# Patient Record
Sex: Female | Born: 1940 | Race: White | Hispanic: No | Marital: Married | Smoking: Former smoker
Health system: Southern US, Community
[De-identification: ages and names within clinical notes are randomized; demographics above are authoritative.]

## PROBLEM LIST (undated history)

## (undated) DIAGNOSIS — I499 Cardiac arrhythmia, unspecified: Secondary | ICD-10-CM

## (undated) DIAGNOSIS — M8580 Other specified disorders of bone density and structure, unspecified site: Secondary | ICD-10-CM

## (undated) DIAGNOSIS — I10 Essential (primary) hypertension: Secondary | ICD-10-CM

## (undated) DIAGNOSIS — Q211 Atrial septal defect, unspecified: Secondary | ICD-10-CM

## (undated) DIAGNOSIS — E049 Nontoxic goiter, unspecified: Secondary | ICD-10-CM

## (undated) DIAGNOSIS — F419 Anxiety disorder, unspecified: Secondary | ICD-10-CM

## (undated) DIAGNOSIS — R112 Nausea with vomiting, unspecified: Secondary | ICD-10-CM

## (undated) DIAGNOSIS — Z9889 Other specified postprocedural states: Secondary | ICD-10-CM

## (undated) DIAGNOSIS — R0989 Other specified symptoms and signs involving the circulatory and respiratory systems: Secondary | ICD-10-CM

## (undated) DIAGNOSIS — K5730 Diverticulosis of large intestine without perforation or abscess without bleeding: Secondary | ICD-10-CM

## (undated) DIAGNOSIS — I4891 Unspecified atrial fibrillation: Secondary | ICD-10-CM

## (undated) DIAGNOSIS — C50411 Malignant neoplasm of upper-outer quadrant of right female breast: Principal | ICD-10-CM

## (undated) HISTORY — DX: Atrial septal defect, unspecified: Q21.10

## (undated) HISTORY — PX: VARICOSE VEIN SURGERY: SHX832

## (undated) HISTORY — DX: Anxiety disorder, unspecified: F41.9

## (undated) HISTORY — DX: Other specified symptoms and signs involving the circulatory and respiratory systems: R09.89

## (undated) HISTORY — DX: Nontoxic goiter, unspecified: E04.9

## (undated) HISTORY — DX: Atrial septal defect: Q21.1

## (undated) HISTORY — DX: Other specified disorders of bone density and structure, unspecified site: M85.80

## (undated) HISTORY — DX: Essential (primary) hypertension: I10

## (undated) HISTORY — DX: Diverticulosis of large intestine without perforation or abscess without bleeding: K57.30

## (undated) HISTORY — DX: Unspecified atrial fibrillation: I48.91

## (undated) HISTORY — PX: ABDOMINAL HYSTERECTOMY: SHX81

## (undated) HISTORY — DX: Malignant neoplasm of upper-outer quadrant of right female breast: C50.411

## (undated) HISTORY — PX: BUNIONECTOMY: SHX129

## (undated) HISTORY — PX: ASD REPAIR: SHX258

---

## 2001-04-14 ENCOUNTER — Encounter: Admission: RE | Admit: 2001-04-14 | Discharge: 2001-04-14 | Payer: Self-pay | Admitting: Family Medicine

## 2001-04-14 ENCOUNTER — Encounter: Payer: Self-pay | Admitting: Family Medicine

## 2001-05-14 ENCOUNTER — Ambulatory Visit (HOSPITAL_COMMUNITY): Admission: RE | Admit: 2001-05-14 | Discharge: 2001-05-14 | Payer: Self-pay | Admitting: Neurosurgery

## 2001-05-15 ENCOUNTER — Encounter: Payer: Self-pay | Admitting: Neurosurgery

## 2001-05-15 ENCOUNTER — Ambulatory Visit (HOSPITAL_COMMUNITY): Admission: RE | Admit: 2001-05-15 | Discharge: 2001-05-15 | Payer: Self-pay | Admitting: Neurosurgery

## 2003-05-05 ENCOUNTER — Ambulatory Visit (HOSPITAL_COMMUNITY): Admission: RE | Admit: 2003-05-05 | Discharge: 2003-05-05 | Payer: Self-pay | Admitting: Family Medicine

## 2004-01-12 ENCOUNTER — Ambulatory Visit (HOSPITAL_COMMUNITY): Admission: RE | Admit: 2004-01-12 | Discharge: 2004-01-12 | Payer: Self-pay | Admitting: Family Medicine

## 2005-01-13 ENCOUNTER — Ambulatory Visit (HOSPITAL_COMMUNITY): Admission: RE | Admit: 2005-01-13 | Discharge: 2005-01-13 | Payer: Self-pay | Admitting: Family Medicine

## 2005-01-24 ENCOUNTER — Encounter: Admission: RE | Admit: 2005-01-24 | Discharge: 2005-01-24 | Payer: Self-pay | Admitting: Family Medicine

## 2005-02-09 ENCOUNTER — Emergency Department (HOSPITAL_COMMUNITY): Admission: EM | Admit: 2005-02-09 | Discharge: 2005-02-09 | Payer: Self-pay | Admitting: Emergency Medicine

## 2005-07-28 ENCOUNTER — Encounter: Admission: RE | Admit: 2005-07-28 | Discharge: 2005-07-28 | Payer: Self-pay | Admitting: Family Medicine

## 2005-12-18 ENCOUNTER — Other Ambulatory Visit: Admission: RE | Admit: 2005-12-18 | Discharge: 2005-12-18 | Payer: Self-pay | Admitting: Family Medicine

## 2006-01-19 ENCOUNTER — Encounter: Admission: RE | Admit: 2006-01-19 | Discharge: 2006-01-19 | Payer: Self-pay | Admitting: Family Medicine

## 2007-03-11 ENCOUNTER — Encounter: Admission: RE | Admit: 2007-03-11 | Discharge: 2007-03-11 | Payer: Self-pay | Admitting: Family Medicine

## 2008-03-13 ENCOUNTER — Encounter: Admission: RE | Admit: 2008-03-13 | Discharge: 2008-03-13 | Payer: Self-pay | Admitting: Family Medicine

## 2009-03-15 ENCOUNTER — Encounter: Admission: RE | Admit: 2009-03-15 | Discharge: 2009-03-15 | Payer: Self-pay | Admitting: Family Medicine

## 2010-03-18 ENCOUNTER — Encounter: Admission: RE | Admit: 2010-03-18 | Discharge: 2010-03-18 | Payer: Self-pay | Admitting: Family Medicine

## 2010-12-07 ENCOUNTER — Encounter: Payer: Self-pay | Admitting: Gastroenterology

## 2010-12-10 ENCOUNTER — Encounter: Payer: Self-pay | Admitting: Gastroenterology

## 2011-02-13 ENCOUNTER — Other Ambulatory Visit: Payer: Self-pay | Admitting: Family Medicine

## 2011-02-13 DIAGNOSIS — Z1231 Encounter for screening mammogram for malignant neoplasm of breast: Secondary | ICD-10-CM

## 2011-04-09 ENCOUNTER — Ambulatory Visit
Admission: RE | Admit: 2011-04-09 | Discharge: 2011-04-09 | Disposition: A | Discharge status: Home / Self Care | Payer: Medicare Other | Source: Ambulatory Visit | Attending: Family Medicine | Admitting: Family Medicine

## 2011-04-09 DIAGNOSIS — Z1231 Encounter for screening mammogram for malignant neoplasm of breast: Secondary | ICD-10-CM

## 2012-03-04 ENCOUNTER — Other Ambulatory Visit: Payer: Self-pay | Admitting: Family Medicine

## 2012-03-04 DIAGNOSIS — Z1231 Encounter for screening mammogram for malignant neoplasm of breast: Secondary | ICD-10-CM

## 2012-04-20 ENCOUNTER — Ambulatory Visit
Admission: RE | Admit: 2012-04-20 | Discharge: 2012-04-20 | Disposition: A | Discharge status: Home / Self Care | Payer: Medicare Other | Source: Ambulatory Visit | Attending: Family Medicine | Admitting: Family Medicine

## 2012-04-20 DIAGNOSIS — Z1231 Encounter for screening mammogram for malignant neoplasm of breast: Secondary | ICD-10-CM

## 2013-04-04 ENCOUNTER — Other Ambulatory Visit: Payer: Self-pay

## 2013-04-04 DIAGNOSIS — Z1231 Encounter for screening mammogram for malignant neoplasm of breast: Secondary | ICD-10-CM

## 2013-04-27 ENCOUNTER — Ambulatory Visit
Admission: RE | Admit: 2013-04-27 | Discharge: 2013-04-27 | Disposition: A | Discharge status: Home / Self Care | Payer: Medicare Other | Source: Ambulatory Visit

## 2013-04-27 DIAGNOSIS — Z1231 Encounter for screening mammogram for malignant neoplasm of breast: Secondary | ICD-10-CM

## 2013-09-16 ENCOUNTER — Encounter: Payer: Self-pay | Admitting: Gastroenterology

## 2013-11-11 ENCOUNTER — Encounter: Payer: Self-pay | Admitting: Gastroenterology

## 2014-03-27 ENCOUNTER — Other Ambulatory Visit: Payer: Self-pay

## 2014-03-27 DIAGNOSIS — Z1231 Encounter for screening mammogram for malignant neoplasm of breast: Secondary | ICD-10-CM

## 2014-05-03 ENCOUNTER — Ambulatory Visit
Admission: RE | Admit: 2014-05-03 | Discharge: 2014-05-03 | Disposition: A | Discharge status: Home / Self Care | Payer: Medicare Other | Source: Ambulatory Visit

## 2014-05-03 DIAGNOSIS — Z1231 Encounter for screening mammogram for malignant neoplasm of breast: Secondary | ICD-10-CM

## 2015-04-03 ENCOUNTER — Other Ambulatory Visit: Payer: Self-pay

## 2015-04-03 DIAGNOSIS — Z1231 Encounter for screening mammogram for malignant neoplasm of breast: Secondary | ICD-10-CM

## 2015-05-09 ENCOUNTER — Ambulatory Visit
Admission: RE | Admit: 2015-05-09 | Discharge: 2015-05-09 | Disposition: A | Discharge status: Home / Self Care | Payer: Medicare Other | Source: Ambulatory Visit

## 2015-05-09 DIAGNOSIS — Z1231 Encounter for screening mammogram for malignant neoplasm of breast: Secondary | ICD-10-CM

## 2015-05-11 ENCOUNTER — Other Ambulatory Visit: Payer: Self-pay | Admitting: Family Medicine

## 2015-05-11 DIAGNOSIS — R928 Other abnormal and inconclusive findings on diagnostic imaging of breast: Secondary | ICD-10-CM

## 2015-05-21 ENCOUNTER — Ambulatory Visit
Admission: RE | Admit: 2015-05-21 | Discharge: 2015-05-21 | Disposition: A | Discharge status: Home / Self Care | Payer: Medicare Other | Source: Ambulatory Visit | Attending: Family Medicine | Admitting: Family Medicine

## 2015-05-21 ENCOUNTER — Other Ambulatory Visit: Payer: Self-pay | Admitting: Family Medicine

## 2015-05-21 DIAGNOSIS — R928 Other abnormal and inconclusive findings on diagnostic imaging of breast: Secondary | ICD-10-CM

## 2015-05-30 ENCOUNTER — Other Ambulatory Visit: Payer: Self-pay | Admitting: Family Medicine

## 2015-05-30 ENCOUNTER — Ambulatory Visit: Payer: Medicare Other

## 2015-05-30 ENCOUNTER — Ambulatory Visit
Admission: RE | Admit: 2015-05-30 | Discharge: 2015-05-30 | Disposition: A | Discharge status: Home / Self Care | Payer: Medicare Other | Source: Ambulatory Visit | Attending: Family Medicine | Admitting: Family Medicine

## 2015-05-30 DIAGNOSIS — R928 Other abnormal and inconclusive findings on diagnostic imaging of breast: Secondary | ICD-10-CM

## 2015-06-04 ENCOUNTER — Ambulatory Visit
Admission: RE | Admit: 2015-06-04 | Discharge: 2015-06-04 | Disposition: A | Discharge status: Home / Self Care | Payer: Medicare Other | Source: Ambulatory Visit | Attending: Family Medicine | Admitting: Family Medicine

## 2015-06-04 DIAGNOSIS — R928 Other abnormal and inconclusive findings on diagnostic imaging of breast: Secondary | ICD-10-CM

## 2015-06-05 ENCOUNTER — Encounter: Payer: Self-pay | Admitting: *Deleted

## 2015-06-05 ENCOUNTER — Telehealth: Payer: Self-pay | Admitting: *Deleted

## 2015-06-05 DIAGNOSIS — C50411 Malignant neoplasm of upper-outer quadrant of right female breast: Secondary | ICD-10-CM

## 2015-06-05 HISTORY — DX: Malignant neoplasm of upper-outer quadrant of right female breast: C50.411

## 2015-06-05 NOTE — Telephone Encounter (Signed)
Confirmed [facility] for 06/13/15 at 1230 .  Instructions and contact information given. 

## 2015-06-06 ENCOUNTER — Telehealth: Payer: Self-pay | Admitting: *Deleted

## 2015-06-06 NOTE — Telephone Encounter (Signed)
Mailed clinic packet to pt.  

## 2015-06-12 NOTE — Progress Notes (Addendum)
Radiation Oncology         [phone] ________________________________  Initial outpatient Consultation  Name: ARMETA HAMMAD MRN: [id number]  Date: 06/13/2015  DOB: Jun 14, 1941  [id number], MD  Rolm Bookbinder, MD   REFERRING PHYSICIAN: Rolm Bookbinder, MD  DIAGNOSIS:    ICD-9-CM ICD-10-CM   1. Breast cancer of upper-outer quadrant of right female breast (HCC) 174.4 C50.411    Stage 0 Right Breast UOQ  Ductal Carcinoma in Situ, ER+ / PR+, Low Grade   HISTORY OF PRESENT ILLNESS::Teona Jerilynn Mages Nemeroff is a 74 y.o. female who presented with right breast calcifications on screening mammogram.  Biopsy showed intermediate grade DCIS with characteristics as described above in the diagnosis.  She lives near [place] and drives about an hour to reach [place]. She denies prior cancer.  PREVIOUS RADIATION THERAPY: No  PAST MEDICAL HISTORY:  has a past medical history of Breast cancer of upper-outer quadrant of right female breast ([place]) (06/05/2015); Hypertension; Atrial fibrillation ([place]); Diverticulosis of colon; Goiter; Osteopenia; Carotid bruit; ASD (atrial septal defect); and Anxiety.    PAST SURGICAL HISTORY: Past Surgical History  Procedure Laterality Date  . Abdominal hysterectomy    . Asd repair    . Varicose vein surgery Left   . Bunionectomy Bilateral     FAMILY HISTORY: family history is not on file.  SOCIAL HISTORY:  reports that she quit smoking about 35 years ago. Her smoking use included Cigarettes. She smoked 0.50 packs per day. She does not have any smokeless tobacco history on file. She reports that she does not drink alcohol or use illicit drugs. Married with 2 grown children; she was born in [place]. Quit smoking decades ago in 1981.   ALLERGIES: Review of patient's allergies indicates not on file.  MEDICATIONS:  Current Outpatient Prescriptions  Medication Sig Dispense Refill  . aspirin EC 81 MG tablet Take 81 mg by mouth.    Marland Kitchen atorvastatin  (LIPITOR) 40 MG tablet Take 40 mg by mouth.    . Calcium Carb-Cholecalciferol (CALCIUM 600/VITAMIN D3) 600-800 MG-UNIT TABS Take 1 tablet by mouth.    . Cholecalciferol (VITAMIN D-1000 MAX ST) 1000 UNITS tablet Take 2,000 Units by mouth.    . flecainide (TAMBOCOR) 100 MG tablet Take 100 mg by mouth.    . metoprolol tartrate (LOPRESSOR) 25 MG tablet Take 12.5 mg by mouth.    . Multiple Vitamin (MULTIVITAMIN) capsule Take 1 capsule by mouth.    . Rivaroxaban (XARELTO) 15 MG TABS tablet Take 15 mg by mouth.     No current facility-administered medications for this encounter.    REVIEW OF SYSTEMS:  Notable for that above.   PHYSICAL EXAM:   Vitals with Age-Percentiles 06/13/2015  Length [id number] cm  Systolic [id number]  Diastolic 65  Pulse 78  Respiration 20  Weight 81.103 kg  BMI 26.8  VISIT REPORT    General: Alert and oriented, in no acute distress HEENT: Head is normocephalic; Oropharynx is clear. Neck: fullness in region of right thyroid (she reports history of goiter), no palpable cervical or supraclavicular lymphadenopathy. Heart: Regular in rate and rhythm with no murmurs, rubs, or gallops. Chest: Clear to auscultation bilaterally, with no rhonchi, wheezes, or rales. Abdomen: Soft, nontender, nondistended, with no rigidity or guarding. Extremities: No cyanosis; there is some right ankle swelling Lymphatics: see Neck Exam Skin: No concerning lesions. Musculoskeletal: symmetric strength and muscle tone throughout. Neurologic: Cranial nerves II through XII are grossly intact. No obvious focalities. Speech is fluent. Coordination is intact. Psychiatric:  Judgment and insight are intact. Affect is appropriate. Breasts: There is a palpable cord in the UOQ of the right breast; this may be the biopsy tract. There are surrounding ecchymoses. No other palpable lesions in either breast. No axillary masses bilaterally.   ECOG = 0  0 - Asymptomatic (Fully active, able to carry on all predisease  activities without restriction)  1 - Symptomatic but completely ambulatory (Restricted in physically strenuous activity but ambulatory and able to carry out work of a light or sedentary nature. For example, light housework, office work)  2 - Symptomatic, <50% in bed during the day (Ambulatory and capable of all self care but unable to carry out any work activities. Up and about more than 50% of waking hours)  3 - Symptomatic, >50% in bed, but not bedbound (Capable of only limited self-care, confined to bed or chair 50% or more of waking hours)  4 - Bedbound (Completely disabled. Cannot carry on any self-care. Totally confined to bed or chair)  5 - Death   Eustace Pen MM, Creech RH, Tormey DC, et al. [phone]). "Toxicity and response criteria of the [facility]". [place] Oncol. 5 (6): 649-55   LABORATORY DATA:  Lab Results  Component Value Date   WBC 5.1 06/13/2015   HGB 12.5 06/13/2015   HCT 38.3 06/13/2015   MCV 91.2 06/13/2015   PLT 212 06/13/2015   CMP     Component Value Date/Time   NA 141 06/13/2015 1219   K 4.0 06/13/2015 1219   CO2 28 06/13/2015 1219   GLUCOSE 93 06/13/2015 1219   BUN 13.7 06/13/2015 1219   CREATININE 1.0 06/13/2015 1219   CALCIUM 9.7 06/13/2015 1219   PROT 7.6 06/13/2015 1219   ALBUMIN 4.1 06/13/2015 1219   AST 19 06/13/2015 1219   ALT 12 06/13/2015 1219   ALKPHOS 63 06/13/2015 1219   BILITOT 0.47 06/13/2015 1219         RADIOGRAPHY: Mm Digital Diagnostic Unilat R  06/04/2015  CLINICAL DATA:  Confirmation of clip placement after stereotactic tomosynthesis core needle biopsy of suspicious calcifications in the upper outer quadrant of the right breast earlier today. EXAM: DIAGNOSTIC RIGHT MAMMOGRAM POST STEREOTACTIC BIOPSY COMPARISON:  Previous exam(s). FINDINGS: Mammographic images were obtained following stereotactic guided biopsy of a group of suspicious calcifications spanning approximately 3 cm in the upper outer quadrant of  the right breast, middle to posterior depth. The coil shaped tissue marker clip is appropriately positioned at the posterolateral margin of the calcifications. Expected post biopsy changes are present without evidence of hematoma. IMPRESSION: Appropriate positioning of the coil shaped tissue marker clip at the posterolateral margin of the calcifications in the upper outer quadrant of the right breast. Final Assessment: Post Procedure Mammograms for Marker Placement Electronically Signed   By: Evangeline Dakin M.D.   On: 06/04/2015 12:35   Mm Digital Diagnostic Unilat R  05/30/2015  ADDENDUM REPORT: 05/30/2015 14:06 ADDENDUM: The patient is currently on Xarelto and daily aspirin. She was been placed on these anticoagulants by her cardiologist for atrial fibrillation. However, she has been cardioverted and is currently in sinus rhythm. I have discussed her case with her cardiologist and her cardiologist (Dr Marylee Floras) feels that it would be safe to take her off anticoagulation prior to her stereotactic biopsy. She will discontinue her anticoagulation at this time and her biopsy has been rescheduled for Monday, 06/04/2015. Electronically Signed   By: Altamese Cabal M.D.   On: 05/30/2015 14:06  05/30/2015  CLINICAL DATA:  Screening recall for right breast calcifications. EXAM: DIGITAL DIAGNOSTIC RIGHT MAMMOGRAM WITH CAD COMPARISON:  Previous exam(s). ACR Breast Density Category b: There are scattered areas of fibroglandular density. FINDINGS: In the upper-outer quadrant of the right breast, there is a 2.3 cm group of amorphous and punctate calcifications. No other suspicious calcifications, masses or areas of distortion are seen in the bilateral breasts. Mammographic images were processed with CAD. IMPRESSION: There is a suspicious 2.3 cm group of calcifications in the upper-outer quadrant of the right breast. RECOMMENDATION: Stereotactic biopsy is recommended for the right breast calcifications. This has been  scheduled for 05/30/2015 at 11 a.m. I have discussed the findings and recommendations with the patient. Results were also provided in writing at the conclusion of the visit. If applicable, a reminder letter will be sent to the patient regarding the next appointment. BI-RADS CATEGORY  4: Suspicious abnormality - biopsy should be considered. Electronically Signed: By: Ammie Ferrier M.D. On: 05/21/2015 13:54   Mm Rt Breast Bx W Loc Dev 1st Lesion Image Bx Spec Stereo Guide  06/05/2015  ADDENDUM REPORT: 06/05/2015 14:52 ADDENDUM: Pathology reveals Low Grade DUCTAL CARCINOMA IN SITU WITH CALCIFICATIONS, ATYPICAL LOBULAR HYPERPLASIA, FIBROCYSTIC CHANGES WITH MICROCALCIFICATIONS AND USUAL DUCTAL HYPERPLASIA, BENIGN DUCTS WITH MICROCALCIFICATIONS of the upper outer quadrant of the Right breast. This was found to be concordant by Dr. Evangeline Dakin. Pathology results were discussed with the patient via telephone. She reported no problems with the biopsy site and is doing well. Post biopsy care and instructions were reviewed and questions were answered. She was encouraged to contact [facility] with any additional questions and or concerns. She was referred to [facility] on June 13, 2015. Pathology results reported by Terie Purser RN on June 05, 2015. Electronically Signed   By: Evangeline Dakin M.D.   On: 06/05/2015 14:52  06/05/2015  CLINICAL DATA:  74 year old with suspicious calcifications in the upper outer quadrant of the right breast, middle to posterior depth, spanning approximately 3 cm, identified on recent screening mammography. EXAM: RIGHT BREAST STEREOTACTIC CORE NEEDLE BIOPSY COMPARISON:  Previous exams. FINDINGS: The patient and I discussed the procedure of stereotactic-guided biopsy including benefits and alternatives. We discussed the high likelihood of a successful procedure. We discussed  the risks of the procedure including infection, bleeding, tissue injury, clip migration, and inadequate sampling. Informed written consent was given. The usual time out protocol was performed immediately prior to the procedure. Using sterile technique and 1% lidocaine and 1% lidocaine with epinephrine as local anesthetic, under stereotactic guidance, a 12 gauge vacuum assisted needle device was used to perform core needle biopsy of calcifications in the upper outer quadrant of the right breast using a superior approach. Specimen radiograph was performed showing calcifications in at least 5 of the core samples. Specimens with calcifications are identified for pathology. At the conclusion of the procedure, a coil shaped tissue marker clip was deployed into the biopsy cavity. Follow-up 2-view mammogram was performed and dictated separately. IMPRESSION: Stereotactic-guided biopsy of calcifications in the upper outer quadrant of the right breast, middle to posterior depth. No apparent complications. Electronically Signed: By: Evangeline Dakin M.D. On: 06/04/2015 12:35      IMPRESSION/PLAN:DCIS, ER+, right breast  She has been discussed at our multidisciplinary tumor board.  The consensus is that she would be a good candidate for breast conservation. I talked to her about the option of a  mastectomy and informed her that her expected overall survival would be equivalent between mastectomy and breast conservation, based upon randomized controlled data. She is enthusiastic about breast conservation.   She will likely benefit from adjuvant therapy in some form. We discussed the options of anti-estrogen pills, whole breast radiotherapy, or both.  We recommend Oncotype testing for her DCIS to help ascertain her risk.   It would be helpful to review her final pathology including extent of DCIS, margin status, etc,  to determine her best treatment option and weigh the risks and benefits of adjuvant therapy.  She will  likely be recommended at least one adjuvant therapy, and possibly both. Will review results of Oncotype and path at tumor board; if she is a reasonable candidate for radiotherapy, we will refer her to radiation oncology at [facility].   __________________________________________   Eppie Gibson, MD

## 2015-06-13 ENCOUNTER — Ambulatory Visit
Admission: RE | Admit: 2015-06-13 | Discharge: 2015-06-13 | Disposition: A | Discharge status: Home / Self Care | Payer: Medicare Other | Source: Ambulatory Visit | Attending: Radiation Oncology | Admitting: Radiation Oncology

## 2015-06-13 ENCOUNTER — Encounter: Payer: Self-pay | Admitting: Hematology and Oncology

## 2015-06-13 ENCOUNTER — Encounter: Payer: Self-pay | Admitting: Nurse Practitioner

## 2015-06-13 ENCOUNTER — Other Ambulatory Visit (HOSPITAL_BASED_OUTPATIENT_CLINIC_OR_DEPARTMENT_OTHER): Payer: Medicare Other

## 2015-06-13 ENCOUNTER — Ambulatory Visit (HOSPITAL_BASED_OUTPATIENT_CLINIC_OR_DEPARTMENT_OTHER): Payer: Medicare Other | Admitting: Hematology and Oncology

## 2015-06-13 VITALS — BP 150/65 | HR 78 | Temp 97.1°F | Resp 20 | Ht 68.5 in | Wt 178.8 lb

## 2015-06-13 DIAGNOSIS — C50411 Malignant neoplasm of upper-outer quadrant of right female breast: Secondary | ICD-10-CM

## 2015-06-13 DIAGNOSIS — Z170 Estrogen receptor positive status [ER+]: Secondary | ICD-10-CM | POA: Diagnosis not present

## 2015-06-13 DIAGNOSIS — D0511 Intraductal carcinoma in situ of right breast: Secondary | ICD-10-CM

## 2015-06-13 LAB — COMPREHENSIVE METABOLIC PANEL
ALBUMIN: 4.1 g/dL (ref 3.5–5.0)
ALK PHOS: 63 U/L (ref 40–150)
ALT: 12 U/L (ref 0–55)
ANION GAP: 7 meq/L (ref 3–11)
AST: 19 U/L (ref 5–34)
BUN: 13.7 mg/dL (ref 7.0–26.0)
CALCIUM: 9.7 mg/dL (ref 8.4–10.4)
CO2: 28 mEq/L (ref 22–29)
Chloride: 106 mEq/L (ref 98–109)
Creatinine: 1 mg/dL (ref 0.6–1.1)
EGFR: 54 mL/min/{1.73_m2} — ABNORMAL LOW (ref >90)
Glucose: 93 mg/dl (ref 70–140)
Potassium: 4 mEq/L (ref 3.5–5.1)
Sodium: 141 mEq/L (ref 136–145)
TOTAL PROTEIN: 7.6 g/dL (ref 6.4–8.3)
Total Bilirubin: 0.47 mg/dL (ref 0.20–1.20)

## 2015-06-13 LAB — CBC WITH DIFFERENTIAL/PLATELET
BASO%: 0.6 % (ref 0.0–2.0)
Basophils Absolute: 0 10*3/uL (ref 0.0–0.1)
EOS%: 3.7 % (ref 0.0–7.0)
Eosinophils Absolute: 0.2 10*3/uL (ref 0.0–0.5)
HEMATOCRIT: 38.3 % (ref 34.8–46.6)
HEMOGLOBIN: 12.5 g/dL (ref 11.6–15.9)
LYMPH#: 1.6 10*3/uL (ref 0.9–3.3)
LYMPH%: 30.9 % (ref 14.0–49.7)
MCH: 29.8 pg (ref 25.1–34.0)
MCHC: 32.6 g/dL (ref 31.5–36.0)
MCV: 91.2 fL (ref 79.5–101.0)
MONO#: 0.4 10*3/uL (ref 0.1–0.9)
MONO%: 6.8 % (ref 0.0–14.0)
NEUT%: 58 % (ref 38.4–76.8)
NEUTROS ABS: 3 10*3/uL (ref 1.5–6.5)
PLATELETS: 212 10*3/uL (ref 145–400)
RBC: 4.2 10*6/uL (ref 3.70–5.45)
RDW: 15.4 % — AB (ref 11.2–14.5)
WBC: 5.1 10*3/uL (ref 3.9–10.3)

## 2015-06-13 NOTE — Progress Notes (Signed)
[facility] CONSULT NOTE  Patient Care Team: Archer Asa. Colin Rhein, MD as PCP - General (Family Medicine) Rolm Bookbinder, MD as Consulting Physician (General Surgery) Nicholas Lose, MD as Consulting Physician (Hematology and Oncology) Eppie Gibson, MD as Attending Physician (Radiation Oncology)  CHIEF COMPLAINTS/PURPOSE OF CONSULTATION:  Newly diagnosed breast cancer  HISTORY OF PRESENTING ILLNESS:  Evelyn Scott 74 y.o. female is here because of recent diagnosis of right breast DCIS. Patient had his routine screening mammogram that revealed right upper outer quadrant calcifications measuring 2.3 cm. Stereotactic biopsy was performed that revealed intermediate grade DCIS with calcifications along with atypical lobular hyperplasia. Pathology showed it was ER/PR positive. She was presented at the multidisciplinary tumor board and she is here today at the breast clinic to discuss a treatment plan. She is accompanied by her husband.  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 She was originally from [place]  I reviewed her records extensively and collaborated the history with the patient.  SUMMARY OF ONCOLOGIC HISTORY:   Breast cancer of upper-outer quadrant of right female breast ([place])   05/21/2015 Mammogram Right breast suspicious calcifications spanning approximately 2.3 cm mid to posterior depth, upper outer quadrant   06/04/2015 Initial Diagnosis Right breast biopsy: DCIS with calcifications, ALH, fibrocystic changes, UV edge, ER 100%, PR 90%, low-grade, Tis N0 stage 0    In terms of breast cancer risk profile:  She menarched at early age of 89 and went to menopause at age 71  She had 2 pregnancy, her first child was born at age 86  She has not received birth control pills.  She was never exposed to fertility medications or hormone replacement therapy.  She has no family history of Breast/GYN/GI cancer  MEDICAL HISTORY:  Past Medical  History  Diagnosis Date  . Breast cancer of upper-outer quadrant of right female breast ([place]) 06/05/2015  . Hypertension   . Atrial fibrillation ([place])   . Diverticulosis of colon   . Goiter   . Osteopenia   . Carotid bruit   . ASD (atrial septal defect)   . Anxiety     SURGICAL HISTORY: Past Surgical History  Procedure Laterality Date  . Abdominal hysterectomy    . Asd repair    . Varicose vein surgery Left   . Bunionectomy Bilateral     SOCIAL HISTORY: Social History   Social History  . Marital Status: Married    Spouse Name: N/A  . Number of Children: N/A  . Years of Education: N/A   Occupational History  . Not on file.   Social History Main Topics  . Smoking status: Former Smoker -- 0.50 packs/day    Types: Cigarettes    Quit date: 06/12/1980  . Smokeless tobacco: Not on file  . Alcohol Use: No  . Drug Use: No  . Sexual Activity: Not on file   Other Topics Concern  . Not on file   Social History Narrative    FAMILY HISTORY: History reviewed. No pertinent family history.  ALLERGIES:  has no allergies on file.  MEDICATIONS:  Current Outpatient Prescriptions  Medication Sig Dispense Refill  . aspirin EC 81 MG tablet Take 81 mg by mouth.    Marland Kitchen atorvastatin (LIPITOR) 40 MG tablet Take 40 mg by mouth.    . Calcium Carb-Cholecalciferol (CALCIUM 600/VITAMIN D3) 600-800 MG-UNIT TABS Take 1 tablet by mouth.    . Cholecalciferol (VITAMIN D-1000 MAX ST) 1000 UNITS tablet Take 2,000 Units by  mouth.    . flecainide (TAMBOCOR) 100 MG tablet Take 100 mg by mouth.    . metoprolol tartrate (LOPRESSOR) 25 MG tablet Take 12.5 mg by mouth.    . Multiple Vitamin (MULTIVITAMIN) capsule Take 1 capsule by mouth.    . Rivaroxaban (XARELTO) 15 MG TABS tablet Take 15 mg by mouth.     No current facility-administered medications for this visit.    REVIEW OF SYSTEMS:   Constitutional: Denies fevers, chills or abnormal night sweats Eyes: Denies blurriness of vision, double  vision or watery eyes Ears, nose, mouth, throat, and face: Denies mucositis or sore throat Respiratory: Denies cough, dyspnea or wheezes Cardiovascular: Denies palpitation, chest discomfort or lower extremity swelling Gastrointestinal:  Denies nausea, heartburn or change in bowel habits Skin: Denies abnormal skin rashes Lymphatics: Denies new lymphadenopathy or easy bruising Neurological:Denies numbness, tingling or new weaknesses Behavioral/Psych: Mood is stable, no new changes  Breast:  Denies any palpable lumps or discharge All other systems were reviewed with the patient and are negative.  PHYSICAL EXAMINATION: ECOG PERFORMANCE STATUS: 1 - Symptomatic but completely ambulatory  Filed Vitals:   06/13/15 1229  BP: 150/65  Pulse: 78  Temp: 97.1 F (36.2 C)  Resp: 20   Filed Weights   06/13/15 1229  Weight: 178 lb 12.8 oz (81.103 kg)    GENERAL:alert, no distress and comfortable SKIN: skin color, texture, turgor are normal, no rashes or significant lesions EYES: normal, conjunctiva are pink and non-injected, sclera clear OROPHARYNX:no exudate, no erythema and lips, buccal mucosa, and tongue normal  NECK: supple, thyroid normal size, non-tender, without nodularity LYMPH:  no palpable lymphadenopathy in the cervical, axillary or inguinal LUNGS: clear to auscultation and percussion with normal breathing effort HEART: regular rate & rhythm and no murmurs and no lower extremity edema ABDOMEN:abdomen soft, non-tender and normal bowel sounds Musculoskeletal:no cyanosis of digits and no clubbing  PSYCH: alert & oriented x 3 with fluent speech NEURO: no focal motor/sensory deficits BREAST: No palpable nodules in breast. No palpable axillary or supraclavicular lymphadenopathy (exam performed in the presence of a chaperone)   LABORATORY DATA:  I have reviewed the data as listed Lab Results  Component Value Date   WBC 5.1 06/13/2015   HGB 12.5 06/13/2015   HCT 38.3 06/13/2015    MCV 91.2 06/13/2015   PLT 212 06/13/2015   Lab Results  Component Value Date   NA 141 06/13/2015   K 4.0 06/13/2015   CO2 28 06/13/2015   ASSESSMENT AND PLAN:  Breast cancer of upper-outer quadrant of right female breast (HCC) Right breast stereotactic biopsy 06/04/2015: DCIS with calcifications, ALH, fibrocystic changes, UV edge, ER 100%, PR 90%, low-grade, Tis N0 stage 0  Pathology review: I discussed with the patient the difference between DCIS and invasive breast cancer. It is considered a precancerous lesion. DCIS is classified as a 0. It is generally detected through mammograms as calcifications. We discussed the significance of grades and its impact on prognosis. We also discussed the importance of ER and PR receptors and their implications to adjuvant treatment options. Prognosis of DCIS dependence on grade, comedo necrosis. It is anticipated that if not treated, 20-30% of DCIS can develop into invasive breast cancer.  Recommendation: 1. Breast conserving surgery 2. Followed by adjuvant radiation therapy 3. Followed by antiestrogen therapy with tamoxifen 5 years  Return to clinic after surgery to discuss the final pathology report and come up with an adjuvant treatment plan.  All questions were answered. The patient  knows to call the clinic with any problems, questions or concerns.    Rulon Eisenmenger, MD 3:29 PM

## 2015-06-13 NOTE — Progress Notes (Signed)
Evelyn Scott is a very pleasant 74 y.o. female from [place], [place] with newly diagnosed ductal carcinoma in situ of the right breast.  Biopsy results revealed the tumor's prognostic profile is ER positive and PR positive.  She presents today to the [facility]) for treatment consideration and recommendations from the breast surgeon, radiation oncologist, and medical oncologist.     I briefly met with Evelyn Scott during her [facility] visit today. We discussed the purpose of the Survivorship Clinic, which will include monitoring for recurrence, coordinating completion of age and gender-appropriate cancer screenings, promotion of overall wellness, as well as managing potential late/long-term side effects of anti-cancer treatments.    The treatment plan for Evelyn Scott will likely include surgery, radiation therapy (pending results of oncotype DCIS testing) and anti-estrogen therapy. As of today, the intent of treatment for Evelyn Scott is cure, therefore she will be eligible for the Survivorship Clinic upon her completion of treatment.  Her survivorship care plan (SCP) document will be drafted and updated throughout the course of her treatment trajectory. She will receive the SCP in an office visit with myself in the Survivorship Clinic once she has completed treatment.   Evelyn Scott was encouraged to ask questions and all questions were answered to her satisfaction.  She was given my business card and encouraged to contact me with any concerns regarding survivorship.  I look forward to participating in her care.   Kenn File, [place] [phone]

## 2015-06-13 NOTE — Assessment & Plan Note (Signed)
Right breast stereotactic biopsy 06/04/2015: DCIS with calcifications, ALH, fibrocystic changes, UV edge, ER 100%, PR 90%, low-grade, Tis N0 stage 0  Pathology review: I discussed with the patient the difference between DCIS and invasive breast cancer. It is considered a precancerous lesion. DCIS is classified as a 0. It is generally detected through mammograms as calcifications. We discussed the significance of grades and its impact on prognosis. We also discussed the importance of ER and PR receptors and their implications to adjuvant treatment options. Prognosis of DCIS dependence on grade, comedo necrosis. It is anticipated that if not treated, 20-30% of DCIS can develop into invasive breast cancer.  Recommendation: 1. Breast conserving surgery 2. Followed by adjuvant radiation therapy 3. Followed by antiestrogen therapy with tamoxifen 5 years  Tamoxifen counseling: We discussed the risks and benefits of tamoxifen. These include but not limited to insomnia, hot flashes, mood changes, vaginal dryness, and weight gain. Although rare, serious side effects including endometrial cancer, risk of blood clots were also discussed. We strongly believe that the benefits far outweigh the risks. Patient understands these risks and consented to starting treatment. Planned treatment duration is 5 years.  Return to clinic after surgery to discuss the final pathology report and come up with an adjuvant treatment plan.

## 2015-06-13 NOTE — Progress Notes (Signed)
  [facility] Psychosocial Distress Screening Counseling Intern  Counseling Intern was referred by distress screening protocol.  The patient scored a 6 on the Psychosocial Distress Thermometer which indicates Moderate distress. Counseling Intern to assess for distress and other psychosocial needs.   ONCBCN DISTRESS SCREENING 06/13/2015  Screening Type Initial Screening  Distress experienced in past week (1-10) 6  Practical problem type Insurance;Transportation  Family Problem type Partner  Emotional problem type Nervousness/Anxiety;Adjusting to illness;Adjusting to appearance changes  Spiritual/Religous concerns type Facing my mortality  Information Concerns Type Lack of info about diagnosis  Physical Problem type Tingling hands/feet  Referral to support programs Yes   Counseling Intern Note:  Met with Pt and husband Clair Gulling during [facility].  Pt presented as well groomed and oriented X4 with appropriate affect.  Pt reported that her distress had decreased from a 6 to a 4 by the time of our encounter.   Pt stated that her transportation concerns are regarding the hour long drive from her home to [facility] for radiation 5 days/week, but indicated that her treatment team is attempting to make arrangements for her to have radiation in [place], closer to home.    Pt reported that her family concerns are regarding her husband who she reports has been Dx with early signs of dementia.  She stated that his medication is managing his symptoms well but worries about how he will manage if something were to happen to here.  She reports that one of her sons lives in [place] and is available to help when needed.    Pt reports having a strong support system in family (siblings, children, grandchildren, each other). Pt endorsed feelings of nervousness/anxiety in relation to her Dx but denies ongoing anxiety issues.   Counseling intern introduced and provided information about services and resources available at the  support center. Pt and husband stated that they felt very cared for during [facility].   No specific support center follow up is indicated at this time.    Vaughan Sine Counseling Intern  [phone]

## 2015-06-15 ENCOUNTER — Other Ambulatory Visit: Payer: Self-pay | Admitting: General Surgery

## 2015-06-15 DIAGNOSIS — C50411 Malignant neoplasm of upper-outer quadrant of right female breast: Secondary | ICD-10-CM

## 2015-06-19 ENCOUNTER — Telehealth: Payer: Self-pay | Admitting: *Deleted

## 2015-06-19 NOTE — Telephone Encounter (Signed)
Left message for a return phone call to follow up from [facility] 12/14.

## 2015-06-26 ENCOUNTER — Other Ambulatory Visit: Payer: Self-pay | Admitting: General Surgery

## 2015-06-26 ENCOUNTER — Encounter (HOSPITAL_BASED_OUTPATIENT_CLINIC_OR_DEPARTMENT_OTHER): Payer: Self-pay | Admitting: *Deleted

## 2015-06-26 DIAGNOSIS — C50411 Malignant neoplasm of upper-outer quadrant of right female breast: Secondary | ICD-10-CM

## 2015-06-29 ENCOUNTER — Encounter: Payer: Self-pay | Admitting: *Deleted

## 2015-06-29 ENCOUNTER — Telehealth: Payer: Self-pay | Admitting: Hematology and Oncology

## 2015-06-29 NOTE — Telephone Encounter (Signed)
Aware of follow up made per pof

## 2015-07-04 ENCOUNTER — Encounter (HOSPITAL_BASED_OUTPATIENT_CLINIC_OR_DEPARTMENT_OTHER)
Admission: RE | Admit: 2015-07-04 | Discharge: 2015-07-04 | Disposition: A | Discharge status: Home / Self Care | Payer: Medicare Other | Source: Ambulatory Visit | Attending: General Surgery | Admitting: General Surgery

## 2015-07-04 DIAGNOSIS — C50911 Malignant neoplasm of unspecified site of right female breast: Secondary | ICD-10-CM | POA: Diagnosis not present

## 2015-07-04 DIAGNOSIS — Z01818 Encounter for other preprocedural examination: Secondary | ICD-10-CM | POA: Insufficient documentation

## 2015-07-04 LAB — CBC WITH DIFFERENTIAL/PLATELET
BASOS PCT: 1 %
Basophils Absolute: 0 10*3/uL (ref 0.0–0.1)
EOS PCT: 4 %
Eosinophils Absolute: 0.2 10*3/uL (ref 0.0–0.7)
HEMATOCRIT: 37.7 % (ref 36.0–46.0)
Hemoglobin: 12.1 g/dL (ref 12.0–15.0)
LYMPHS PCT: 36 %
Lymphs Abs: 1.8 10*3/uL (ref 0.7–4.0)
MCH: 29.4 pg (ref 26.0–34.0)
MCHC: 32.1 g/dL (ref 30.0–36.0)
MCV: 91.7 fL (ref 78.0–100.0)
MONO ABS: 0.4 10*3/uL (ref 0.1–1.0)
MONOS PCT: 9 %
NEUTROS ABS: 2.5 10*3/uL (ref 1.7–7.7)
Neutrophils Relative %: 50 %
PLATELETS: 217 10*3/uL (ref 150–400)
RBC: 4.11 MIL/uL (ref 3.87–5.11)
RDW: 15.5 % (ref 11.5–15.5)
WBC: 4.9 10*3/uL (ref 4.0–10.5)

## 2015-07-04 LAB — BASIC METABOLIC PANEL
ANION GAP: 8 (ref 5–15)
BUN: 12 mg/dL (ref 6–20)
CALCIUM: 9.3 mg/dL (ref 8.9–10.3)
CO2: 28 mmol/L (ref 22–32)
Chloride: 106 mmol/L (ref 101–111)
Creatinine, Ser: 0.95 mg/dL (ref 0.44–1.00)
GFR calc Af Amer: >60 mL/min (ref >60)
GFR calc non Af Amer: 58 mL/min — ABNORMAL LOW (ref >60)
GLUCOSE: 101 mg/dL — AB (ref 65–99)
Potassium: 4.5 mmol/L (ref 3.5–5.1)
Sodium: 142 mmol/L (ref 135–145)

## 2015-07-05 ENCOUNTER — Ambulatory Visit
Admission: RE | Admit: 2015-07-05 | Discharge: 2015-07-05 | Disposition: A | Discharge status: Home / Self Care | Payer: Medicare Other | Source: Ambulatory Visit | Attending: General Surgery | Admitting: General Surgery

## 2015-07-05 ENCOUNTER — Encounter: Payer: Self-pay | Admitting: *Deleted

## 2015-07-05 DIAGNOSIS — C50411 Malignant neoplasm of upper-outer quadrant of right female breast: Secondary | ICD-10-CM

## 2015-07-10 ENCOUNTER — Ambulatory Visit (HOSPITAL_BASED_OUTPATIENT_CLINIC_OR_DEPARTMENT_OTHER)
Admission: RE | Admit: 2015-07-10 | Discharge: 2015-07-10 | Disposition: A | Discharge status: Home / Self Care | Payer: Medicare Other | Source: Ambulatory Visit | Attending: General Surgery | Admitting: General Surgery

## 2015-07-10 ENCOUNTER — Ambulatory Visit (HOSPITAL_BASED_OUTPATIENT_CLINIC_OR_DEPARTMENT_OTHER): Payer: Medicare Other | Admitting: Certified Registered"

## 2015-07-10 ENCOUNTER — Ambulatory Visit
Admission: RE | Admit: 2015-07-10 | Discharge: 2015-07-10 | Disposition: A | Discharge status: Home / Self Care | Payer: Medicare Other | Source: Ambulatory Visit | Attending: General Surgery | Admitting: General Surgery

## 2015-07-10 ENCOUNTER — Encounter (HOSPITAL_BASED_OUTPATIENT_CLINIC_OR_DEPARTMENT_OTHER): Payer: Self-pay | Admitting: Certified Registered"

## 2015-07-10 ENCOUNTER — Encounter (HOSPITAL_BASED_OUTPATIENT_CLINIC_OR_DEPARTMENT_OTHER)
Admission: RE | Disposition: A | Discharge status: Home / Self Care | Payer: Self-pay | Source: Ambulatory Visit | Attending: General Surgery

## 2015-07-10 DIAGNOSIS — I4891 Unspecified atrial fibrillation: Secondary | ICD-10-CM | POA: Diagnosis not present

## 2015-07-10 DIAGNOSIS — Z87891 Personal history of nicotine dependence: Secondary | ICD-10-CM | POA: Diagnosis not present

## 2015-07-10 DIAGNOSIS — Z170 Estrogen receptor positive status [ER+]: Secondary | ICD-10-CM | POA: Insufficient documentation

## 2015-07-10 DIAGNOSIS — I10 Essential (primary) hypertension: Secondary | ICD-10-CM | POA: Diagnosis not present

## 2015-07-10 DIAGNOSIS — C50411 Malignant neoplasm of upper-outer quadrant of right female breast: Secondary | ICD-10-CM

## 2015-07-10 DIAGNOSIS — D0511 Intraductal carcinoma in situ of right breast: Secondary | ICD-10-CM | POA: Diagnosis present

## 2015-07-10 DIAGNOSIS — K219 Gastro-esophageal reflux disease without esophagitis: Secondary | ICD-10-CM | POA: Insufficient documentation

## 2015-07-10 DIAGNOSIS — Z79899 Other long term (current) drug therapy: Secondary | ICD-10-CM | POA: Diagnosis not present

## 2015-07-10 DIAGNOSIS — I252 Old myocardial infarction: Secondary | ICD-10-CM | POA: Diagnosis not present

## 2015-07-10 DIAGNOSIS — Z7982 Long term (current) use of aspirin: Secondary | ICD-10-CM | POA: Diagnosis not present

## 2015-07-10 DIAGNOSIS — Z7901 Long term (current) use of anticoagulants: Secondary | ICD-10-CM | POA: Insufficient documentation

## 2015-07-10 HISTORY — DX: Other specified postprocedural states: R11.2

## 2015-07-10 HISTORY — DX: Other specified postprocedural states: Z98.890

## 2015-07-10 HISTORY — DX: Nausea with vomiting, unspecified: Z98.890

## 2015-07-10 HISTORY — DX: Cardiac arrhythmia, unspecified: I49.9

## 2015-07-10 HISTORY — PX: BREAST LUMPECTOMY WITH RADIOACTIVE SEED LOCALIZATION: SHX6424

## 2015-07-10 SURGERY — BREAST LUMPECTOMY WITH RADIOACTIVE SEED LOCALIZATION
Anesthesia: General | Site: Breast | Laterality: Right

## 2015-07-10 MED ORDER — PROPOFOL 10 MG/ML IV BOLUS
INTRAVENOUS | Status: DC | PRN
  Administered 2015-07-10: 130 mg via INTRAVENOUS

## 2015-07-10 MED ORDER — LIDOCAINE HCL (CARDIAC) 20 MG/ML IV SOLN
INTRAVENOUS | Status: AC
  Filled 2015-07-10: qty 5

## 2015-07-10 MED ORDER — SCOPOLAMINE 1 MG/3DAYS TD PT72: 1.0000 | MEDICATED_PATCH | Freq: Once | TRANSDERMAL | Status: DC | PRN

## 2015-07-10 MED ORDER — PROMETHAZINE HCL 25 MG RE SUPP
25.0000 mg | Freq: Once | RECTAL | Status: AC
  Administered 2015-07-10: 25 mg via RECTAL

## 2015-07-10 MED ORDER — EPHEDRINE SULFATE 50 MG/ML IJ SOLN
INTRAMUSCULAR | Status: DC | PRN
  Administered 2015-07-10: 5 mg via INTRAVENOUS
  Administered 2015-07-10: 15 mg via INTRAVENOUS
  Administered 2015-07-10: 5 mg via INTRAVENOUS

## 2015-07-10 MED ORDER — LACTATED RINGERS IV SOLN
INTRAVENOUS | Status: DC
  Administered 2015-07-10 (×2): via INTRAVENOUS

## 2015-07-10 MED ORDER — ACETAMINOPHEN 160 MG/5ML PO SOLN: 325.0000 mg | ORAL | Status: DC | PRN

## 2015-07-10 MED ORDER — CEFAZOLIN SODIUM-DEXTROSE 2-3 GM-% IV SOLR
INTRAVENOUS | Status: AC
  Filled 2015-07-10: qty 50

## 2015-07-10 MED ORDER — PROMETHAZINE HCL 25 MG RE SUPP
RECTAL | Status: AC
  Filled 2015-07-10: qty 1

## 2015-07-10 MED ORDER — ONDANSETRON HCL 4 MG/2ML IJ SOLN
INTRAMUSCULAR | Status: AC
  Filled 2015-07-10: qty 2

## 2015-07-10 MED ORDER — FENTANYL CITRATE (PF) 100 MCG/2ML IJ SOLN: 25.0000 ug | INTRAMUSCULAR | Status: DC | PRN

## 2015-07-10 MED ORDER — MIDAZOLAM HCL 2 MG/2ML IJ SOLN: 1.0000 mg | INTRAMUSCULAR | Status: DC | PRN

## 2015-07-10 MED ORDER — GLYCOPYRROLATE 0.2 MG/ML IJ SOLN: 0.2000 mg | Freq: Once | INTRAMUSCULAR | Status: DC | PRN

## 2015-07-10 MED ORDER — DEXAMETHASONE SODIUM PHOSPHATE 10 MG/ML IJ SOLN
INTRAMUSCULAR | Status: AC
  Filled 2015-07-10: qty 1

## 2015-07-10 MED ORDER — FENTANYL CITRATE (PF) 100 MCG/2ML IJ SOLN
50.0000 ug | INTRAMUSCULAR | Status: DC | PRN
  Administered 2015-07-10: 100 ug via INTRAVENOUS

## 2015-07-10 MED ORDER — FENTANYL CITRATE (PF) 100 MCG/2ML IJ SOLN
INTRAMUSCULAR | Status: AC
  Filled 2015-07-10: qty 2

## 2015-07-10 MED ORDER — OXYCODONE HCL 5 MG/5ML PO SOLN: 5.0000 mg | Freq: Once | ORAL | Status: DC | PRN

## 2015-07-10 MED ORDER — BUPIVACAINE HCL (PF) 0.25 % IJ SOLN
INTRAMUSCULAR | Status: DC | PRN
  Administered 2015-07-10: 10 mL

## 2015-07-10 MED ORDER — OXYCODONE HCL 5 MG PO TABS: 5.0000 mg | ORAL_TABLET | Freq: Once | ORAL | Status: DC | PRN

## 2015-07-10 MED ORDER — LIDOCAINE HCL (CARDIAC) 20 MG/ML IV SOLN
INTRAVENOUS | Status: DC | PRN
  Administered 2015-07-10: 60 mg via INTRAVENOUS

## 2015-07-10 MED ORDER — OXYCODONE-ACETAMINOPHEN 10-325 MG PO TABS: 1.0000 | ORAL_TABLET | Freq: Four times a day (QID) | ORAL | Status: DC | PRN

## 2015-07-10 MED ORDER — DEXAMETHASONE SODIUM PHOSPHATE 4 MG/ML IJ SOLN
INTRAMUSCULAR | Status: DC | PRN
  Administered 2015-07-10: 10 mg via INTRAVENOUS

## 2015-07-10 MED ORDER — ONDANSETRON HCL 4 MG/2ML IJ SOLN
INTRAMUSCULAR | Status: DC | PRN
  Administered 2015-07-10: 4 mg via INTRAVENOUS

## 2015-07-10 MED ORDER — SUCCINYLCHOLINE CHLORIDE 20 MG/ML IJ SOLN
INTRAMUSCULAR | Status: DC | PRN
  Administered 2015-07-10: 50 mg via INTRAVENOUS

## 2015-07-10 MED ORDER — PROMETHAZINE HCL 25 MG PO TABS: 25.0000 mg | ORAL_TABLET | Freq: Four times a day (QID) | ORAL | Status: DC | PRN

## 2015-07-10 MED ORDER — ACETAMINOPHEN 325 MG PO TABS: 325.0000 mg | ORAL_TABLET | ORAL | Status: DC | PRN

## 2015-07-10 MED ORDER — CEFAZOLIN SODIUM-DEXTROSE 2-3 GM-% IV SOLR
2.0000 g | INTRAVENOUS | Status: AC
  Administered 2015-07-10: 2 g via INTRAVENOUS

## 2015-07-10 SURGICAL SUPPLY — 59 items
APPLIER CLIP 9.375 MED OPEN (MISCELLANEOUS)
BINDER BREAST LRG (GAUZE/BANDAGES/DRESSINGS) IMPLANT
BINDER BREAST MEDIUM (GAUZE/BANDAGES/DRESSINGS) IMPLANT
BINDER BREAST XLRG (GAUZE/BANDAGES/DRESSINGS) ×3 IMPLANT
BINDER BREAST XXLRG (GAUZE/BANDAGES/DRESSINGS) IMPLANT
BLADE SURG 15 STRL LF DISP TIS (BLADE) ×1 IMPLANT
BLADE SURG 15 STRL SS (BLADE) ×2
CANISTER SUC SOCK COL 7IN (MISCELLANEOUS) IMPLANT
CANISTER SUCT 1200ML W/VALVE (MISCELLANEOUS) IMPLANT
CHLORAPREP W/TINT 26ML (MISCELLANEOUS) ×3 IMPLANT
CLIP APPLIE 9.375 MED OPEN (MISCELLANEOUS) IMPLANT
CLOSURE WOUND 1/2 X4 (GAUZE/BANDAGES/DRESSINGS) ×1
COVER BACK TABLE 60X90IN (DRAPES) ×3 IMPLANT
COVER MAYO STAND STRL (DRAPES) ×3 IMPLANT
COVER PROBE W GEL 5X96 (DRAPES) ×3 IMPLANT
DECANTER SPIKE VIAL GLASS SM (MISCELLANEOUS) IMPLANT
DEVICE DUBIN W/COMP PLATE 8390 (MISCELLANEOUS) ×3 IMPLANT
DRAPE LAPAROSCOPIC ABDOMINAL (DRAPES) ×3 IMPLANT
DRSG TEGADERM 4X4.75 (GAUZE/BANDAGES/DRESSINGS) IMPLANT
ELECT COATED BLADE 2.86 ST (ELECTRODE) ×3 IMPLANT
ELECT REM PT RETURN 9FT ADLT (ELECTROSURGICAL) ×3
ELECTRODE REM PT RTRN 9FT ADLT (ELECTROSURGICAL) ×1 IMPLANT
GLOVE BIO SURGEON STRL SZ 6.5 (GLOVE) ×2 IMPLANT
GLOVE BIO SURGEON STRL SZ7 (GLOVE) ×6 IMPLANT
GLOVE BIO SURGEONS STRL SZ 6.5 (GLOVE) ×1
GLOVE BIOGEL PI IND STRL 7.0 (GLOVE) ×1 IMPLANT
GLOVE BIOGEL PI IND STRL 7.5 (GLOVE) ×1 IMPLANT
GLOVE BIOGEL PI INDICATOR 7.0 (GLOVE) ×2
GLOVE BIOGEL PI INDICATOR 7.5 (GLOVE) ×2
GOWN STRL REUS W/ TWL LRG LVL3 (GOWN DISPOSABLE) ×2 IMPLANT
GOWN STRL REUS W/TWL LRG LVL3 (GOWN DISPOSABLE) ×4
ILLUMINATOR WAVEGUIDE N/F (MISCELLANEOUS) IMPLANT
KIT MARKER MARGIN INK (KITS) ×3 IMPLANT
LIGHT WAVEGUIDE WIDE FLAT (MISCELLANEOUS) IMPLANT
LIQUID BAND (GAUZE/BANDAGES/DRESSINGS) ×3 IMPLANT
MARKER SKIN DUAL TIP RULER LAB (MISCELLANEOUS) ×3 IMPLANT
NEEDLE HYPO 25X1 1.5 SAFETY (NEEDLE) ×3 IMPLANT
NS IRRIG 1000ML POUR BTL (IV SOLUTION) IMPLANT
PACK BASIN DAY SURGERY FS (CUSTOM PROCEDURE TRAY) ×3 IMPLANT
PENCIL BUTTON HOLSTER BLD 10FT (ELECTRODE) ×3 IMPLANT
SLEEVE SCD COMPRESS KNEE MED (MISCELLANEOUS) ×3 IMPLANT
SPONGE GAUZE 4X4 12PLY STER LF (GAUZE/BANDAGES/DRESSINGS) IMPLANT
SPONGE LAP 4X18 X RAY DECT (DISPOSABLE) ×3 IMPLANT
STAPLER VISISTAT 35W (STAPLE) IMPLANT
STRIP CLOSURE SKIN 1/2X4 (GAUZE/BANDAGES/DRESSINGS) ×2 IMPLANT
SUT MNCRL AB 4-0 PS2 18 (SUTURE) ×3 IMPLANT
SUT MON AB 5-0 PS2 18 (SUTURE) IMPLANT
SUT SILK 2 0 SH (SUTURE) IMPLANT
SUT VIC AB 2-0 SH 27 (SUTURE) ×2
SUT VIC AB 2-0 SH 27XBRD (SUTURE) ×1 IMPLANT
SUT VIC AB 3-0 SH 27 (SUTURE) ×2
SUT VIC AB 3-0 SH 27X BRD (SUTURE) ×1 IMPLANT
SUT VIC AB 5-0 PS2 18 (SUTURE) IMPLANT
SYR CONTROL 10ML LL (SYRINGE) ×3 IMPLANT
TOWEL OR 17X24 6PK STRL BLUE (TOWEL DISPOSABLE) ×3 IMPLANT
TOWEL OR NON WOVEN STRL DISP B (DISPOSABLE) ×3 IMPLANT
TUBE CONNECTING 20'X1/4 (TUBING)
TUBE CONNECTING 20X1/4 (TUBING) IMPLANT
YANKAUER SUCT BULB TIP NO VENT (SUCTIONS) IMPLANT

## 2015-07-10 NOTE — H&P (Signed)
75 yof with history of afib, asd repair who presents after undergoing screening mm that shows a right breast uoq area of 2.3 cm of calcifications. this underwent core biopsy with finding of low grade dcis that is er/pr positive there is also alh in the biopsy specimen. she has no prior breast history and has no family history of breast or ovarian cancer. she has no complaints referable to either breast. she is orginally from [place] and is here with her husband today. 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   Other Problems Conni Slipper, RN; 06/13/2015 8:04 AM) Atrial Fibrillation Back Pain Breast Cancer Chest pain Diverticulosis Gastric Ulcer Gastroesophageal Reflux Disease General anesthesia - complications Heart murmur High blood pressure Lump In Breast Myocardial infarction Thyroid Disease  Past Surgical History Conni Slipper, RN; 06/13/2015 8:04 AM) Breast Biopsy Right. Foot Surgery Bilateral. Hysterectomy (due to cancer) - Partial Oral Surgery Valve Replacement  Diagnostic Studies History Conni Slipper, RN; 06/13/2015 8:04 AM) Colonoscopy 5-10 years ago Mammogram within last year Pap Smear >5 years ago  Medication History Conni Slipper, RN; 06/13/2015 8:04 AM) No Current Medications Medications Reconciled  Social History Conni Slipper, RN; 06/13/2015 8:04 AM) Caffeine use Coffee, Tea. No alcohol use No drug use Tobacco use Former smoker.  Family History Conni Slipper, RN; 06/13/2015 8:04 AM) Diabetes Mellitus Sister. Heart Disease Brother, Mother. Heart disease in female family member before age 95 Heart disease in female family member before age 18 Respiratory Condition Father. Thyroid problems Family Members In General, Mother, Sister.  Pregnancy / Birth History Conni Slipper, RN; 06/13/2015 8:04 AM) Age at menarche 56 years. Age of menopause <47 Contraceptive History Oral  contraceptives. Gravida 2 Irregular periods Maternal age 27-20 Para 2  Review of Systems Conni Slipper RN; 06/13/2015 8:04 AM) General Present- Fatigue. Not Present- Appetite Loss, Chills, Fever, Night Sweats, Weight Gain and Weight Loss. Skin Not Present- Change in Wart/Mole, Dryness, Hives, Jaundice, New Lesions, Non-Healing Wounds, Rash and Ulcer. HEENT Present- Hearing Loss and Wears glasses/contact lenses. Not Present- Earache, Hoarseness, Nose Bleed, Oral Ulcers, Ringing in the Ears, Seasonal Allergies, Sinus Pain, Sore Throat, Visual Disturbances and Yellow Eyes. Respiratory Not Present- Bloody sputum, Chronic Cough, Difficulty Breathing, Snoring and Wheezing. Breast Present- Breast Pain. Not Present- Breast Mass, Nipple Discharge and Skin Changes. Cardiovascular Not Present- Chest Pain, Difficulty Breathing Lying Down, Leg Cramps, Palpitations, Rapid Heart Rate, Shortness of Breath and Swelling of Extremities. Gastrointestinal Not Present- Abdominal Pain, Bloating, Bloody Stool, Change in Bowel Habits, Chronic diarrhea, Constipation, Difficulty Swallowing, Excessive gas, Gets full quickly at meals, Hemorrhoids, Indigestion, Nausea, Rectal Pain and Vomiting. Female Genitourinary Not Present- Frequency, Nocturia, Painful Urination, Pelvic Pain and Urgency. Musculoskeletal Present- Back Pain. Not Present- Joint Pain, Joint Stiffness, Muscle Pain, Muscle Weakness and Swelling of Extremities. Neurological Present- Numbness. Not Present- Decreased Memory, Fainting, Headaches, Seizures, Tingling, Tremor, Trouble walking and Weakness. Psychiatric Present- Anxiety and Fearful. Not Present- Bipolar, Change in Sleep Pattern, Depression and Frequent crying. Endocrine Not Present- Cold Intolerance, Excessive Hunger, Hair Changes, Heat Intolerance, Hot flashes and New Diabetes. Hematology Present- Easy Bruising. Not Present- Excessive bleeding, Gland problems, HIV and Persistent  Infections.   Physical Exam Rolm Bookbinder MD; 06/14/2015 4:18 PM) General Mental Status-Alert. Orientation-Oriented X3. Chest and Lung Exam Chest and lung exam reveals -on auscultation, normal breath sounds, no adventitious sounds and normal vocal resonance. Breast Nipples-No Discharge. Breast Lump-No Palpable Breast Mass. Cardiovascular Cardiovascular examination reveals -normal heart sounds,  regular rate and rhythm with no murmurs. Lymphatic Head & Neck General Head & Neck Lymphatics: Bilateral - Description - Normal. Axillary General Axillary Region: Bilateral - Description - Normal. 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 we discussed all available therapies as well. I recommended that we not proceed with sentinel node biopsy due to pathology. we discussed surgical options including lumpectomy vs mastectomy. we discussed lumpectomy and possibility of radiotherapy and/or antiestrogen therapy. we also discussed mastectomy and possiblity of reconstruction with unlikely need for radiotherapy. I told her there is no benefit to mastectomy. I think she technically is good candidate for seed guided lumpectomy. we discussed risks including possbility of positive margins necessitating a second surgery. will proceed with lumpectomy once cardiology gives recommendations. Current Plans Pt Education - Education: Pathology Report given to patient ATRIAL FIBRILLATION (I48.91) Story: will get cardiology recommendations preop as she will need to come off xarelto

## 2015-07-10 NOTE — Interval H&P Note (Signed)
History and Physical Interval Note:  07/10/2015 12:39 PM  Evelyn Scott  has presented today for surgery, with the diagnosis of RIGHT BREAST CANCER  The various methods of treatment have been discussed with the patient and family. After consideration of risks, benefits and other options for treatment, the patient has consented to  Right breast seed guided lumpectomy as a surgical intervention .  The patient's history has been reviewed, patient examined, no change in status, stable for surgery.  I have reviewed the patient's chart and labs.  Questions were answered to the patient's satisfaction.     Rekisha Welling

## 2015-07-10 NOTE — Transfer of Care (Signed)
Immediate Anesthesia Transfer of Care Note  Patient: Evelyn Scott  Procedure(s) Performed: Procedure(s): BREAST LUMPECTOMY WITH RADIOACTIVE SEED LOCALIZATION (Right)  Patient Location: PACU  Anesthesia Type:General  Level of Consciousness: awake, alert  and patient cooperative  Airway & Oxygen Therapy: Patient Spontanous Breathing and Patient connected to face mask oxygen  Post-op Assessment: Report given to RN, Post -op Vital signs reviewed and stable and Patient moving all extremities  Post vital signs: Reviewed and stable  Last Vitals:  Filed Vitals:   07/10/15 1141  BP: 146/70  Pulse: 59  Temp: 36.6 C  Resp: 18    Complications: No apparent anesthesia complications

## 2015-07-10 NOTE — Interval H&P Note (Deleted)
History and Physical Interval Note:  07/10/2015 12:32 PM  Evelyn Scott  has presented today for surgery, with the diagnosis of LEFT BREAST CANCER  The various methods of treatment have been discussed with the patient and family. After consideration of risks, benefits and other options for treatment, the patient has consented to  Procedure(s): BREAST LUMPECTOMY WITH RADIOACTIVE SEED LOCALIZATION (Left) as a surgical intervention .  The patient's history has been reviewed, patient examined, no change in status, stable for surgery.  I have reviewed the patient's chart and labs.  Questions were answered to the patient's satisfaction.     Febe Champa

## 2015-07-10 NOTE — Anesthesia Postprocedure Evaluation (Signed)
Anesthesia Post Note  Patient: Evelyn Scott  Procedure(s) Performed: Procedure(s) (LRB): BREAST LUMPECTOMY WITH RADIOACTIVE SEED LOCALIZATION (Right)  Patient location during evaluation: PACU Anesthesia Type: General Level of consciousness: awake Pain management: pain level controlled Vital Signs Assessment: post-procedure vital signs reviewed and stable Respiratory status: spontaneous breathing Cardiovascular status: stable Postop Assessment: no signs of nausea or vomiting Anesthetic complications: no    Last Vitals:  Filed Vitals:   07/10/15 1445 07/10/15 1515  BP: 112/61 132/73  Pulse: 65 68  Temp:  36.4 C  Resp: 13 20    Last Pain:  Filed Vitals:   07/10/15 1520  PainSc: 0-No pain                 Sharman Garrott

## 2015-07-10 NOTE — Op Note (Signed)
Preoperative diagnosis: Right breast DCIS Postoperative diagnosis: same as above Procedure: Right breast mass seed guided excisional biopsy Surgeon: Dr Serita Grammes Anesthesia: general EBL: minimal Drains none Complications none Specimen right breast tissue marked with paint, additional superior margin, superolateral and lateral margins marked short superior, long lateral, double deep Sponge and needle count was correct to completion Suspicion to recovery stable  Indications: This is a 70 yof who has newly diagnosed right breast dcis.  She was seen in our mdc.  We elected to perform a lumpectomy.  She had a seed placed prior to beginning and I had her mm in the OR. This seed was not directly located where the clip is.  Procedure: After informed consent was obtained the patient was taken to the operating room. She was given antibiotics. Sequential compression devices were on her legs. She was then placed under general anesthesia without complication. She was prepped and draped in the standard sterile surgical fashion. A surgical timeout was then performed.  I infiltrated 1% lidocaine and made an upper outer quadrant incision after locating the seed. I used the lighted retractors to tunnel to the seed. I then removed the seed and the surrounding tissue.this should have had the clip in it from description and mammogram. the posterior margin is the pectoralis muscle.  I removed additional margins also but there was no clip present.  There was no more abnormal tissue and the lateral margin is now the axilla. The superior margin was cleared over the distance the clip should have been from the seed.  Hemostasis was observed. I painted the specimen. Mammogram confirmed removal of the seed.I then closed this with 2-0 Vicryl, 3-0 Vicryl, and 4-0 Monocryl. Glue was placed over the wound.Breast binder was placed. She tolerated this well was extubated and transferred to recovery in stable condition

## 2015-07-10 NOTE — Anesthesia Preprocedure Evaluation (Signed)
Anesthesia Evaluation  Patient identified by MRN, date of birth, ID band Patient awake    Reviewed: Allergy & Precautions, NPO status , Patient's Chart, lab work & pertinent test results  History of Anesthesia Complications (+) PONV and history of anesthetic complications  Airway Mallampati: II  TM Distance: >3 FB Neck ROM: Full    Dental  (+) Teeth Intact   Pulmonary neg shortness of breath, neg sleep apnea, neg COPD, neg recent URI, former smoker,    breath sounds clear to auscultation       Cardiovascular hypertension, Pt. on medications and Pt. on home beta blockers + dysrhythmias Atrial Fibrillation  Rhythm:Irregular  asd repair   Neuro/Psych PSYCHIATRIC DISORDERS Anxiety negative neurological ROS     GI/Hepatic negative GI ROS, Neg liver ROS,   Endo/Other  negative endocrine ROS  Renal/GU negative Renal ROS     Musculoskeletal   Abdominal   Peds  Hematology negative hematology ROS (+)   Anesthesia Other Findings   Reproductive/Obstetrics                             Anesthesia Physical Anesthesia Plan  ASA: III  Anesthesia Plan: General   Post-op Pain Management:    Induction: Intravenous  Airway Management Planned: LMA  Additional Equipment: None  Intra-op Plan:   Post-operative Plan: Extubation in OR  Informed Consent: I have reviewed the patients History and Physical, chart, labs and discussed the procedure including the risks, benefits and alternatives for the proposed anesthesia with the patient or authorized representative who has indicated his/her understanding and acceptance.   Dental advisory given  Plan Discussed with: CRNA and Surgeon  Anesthesia Plan Comments:         Anesthesia Quick Evaluation

## 2015-07-10 NOTE — H&P (Deleted)
75 yof with history of afib, asd repair who presents after undergoing screening mm that shows a left braest uoq area of 2.3 cm of calcifications. this underwent core biopsy with finding of low grade dcis that is er/pr positive there is also alh in the biopsy specimen. she has no prior breast history and has no family history of breast or ovarian cancer. she has no complaints referable to either breast. she is orginally from [place] and is here with her husband today. 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  Other Problems Conni Slipper, RN; 06/13/2015 8:04 AM) Atrial Fibrillation Back Pain Breast Cancer Chest pain Diverticulosis Gastric Ulcer Gastroesophageal Reflux Disease General anesthesia - complications Heart murmur High blood pressure Lump In Breast Myocardial infarction Thyroid Disease  Past Surgical History Conni Slipper, RN; 06/13/2015 8:04 AM) Breast Biopsy Right. Foot Surgery Bilateral. Hysterectomy (due to cancer) - Partial Oral Surgery Valve Replacement  Diagnostic Studies History Conni Slipper, RN; 06/13/2015 8:04 AM) Colonoscopy 5-10 years ago Mammogram within last year Pap Smear >5 years ago  Medication History Conni Slipper, RN; 06/13/2015 8:04 AM) No Current Medications Medications Reconciled  Social History Conni Slipper, RN; 06/13/2015 8:04 AM) Caffeine use Coffee, Tea. No alcohol use No drug use Tobacco use Former smoker.  Family History Conni Slipper, RN; 06/13/2015 8:04 AM) Diabetes Mellitus Sister. Heart Disease Brother, Mother. Heart disease in female family member before age 55 Heart disease in female family member before age 60 Respiratory Condition Father. Thyroid problems Family Members In General, Mother, Sister.  Pregnancy / Birth History Conni Slipper, RN; 06/13/2015 8:04 AM) Age at menarche 62 years. Age of menopause <47 Contraceptive History Oral  contraceptives. Gravida 2 Irregular periods Maternal age 110-20 Para 2  Review of Systems Conni Slipper RN; 06/13/2015 8:04 AM) General Present- Fatigue. Not Present- Appetite Loss, Chills, Fever, Night Sweats, Weight Gain and Weight Loss. Skin Not Present- Change in Wart/Mole, Dryness, Hives, Jaundice, New Lesions, Non-Healing Wounds, Rash and Ulcer. HEENT Present- Hearing Loss and Wears glasses/contact lenses. Not Present- Earache, Hoarseness, Nose Bleed, Oral Ulcers, Ringing in the Ears, Seasonal Allergies, Sinus Pain, Sore Throat, Visual Disturbances and Yellow Eyes. Respiratory Not Present- Bloody sputum, Chronic Cough, Difficulty Breathing, Snoring and Wheezing. Breast Present- Breast Pain. Not Present- Breast Mass, Nipple Discharge and Skin Changes. Cardiovascular Not Present- Chest Pain, Difficulty Breathing Lying Down, Leg Cramps, Palpitations, Rapid Heart Rate, Shortness of Breath and Swelling of Extremities. Gastrointestinal Not Present- Abdominal Pain, Bloating, Bloody Stool, Change in Bowel Habits, Chronic diarrhea, Constipation, Difficulty Swallowing, Excessive gas, Gets full quickly at meals, Hemorrhoids, Indigestion, Nausea, Rectal Pain and Vomiting. Female Genitourinary Not Present- Frequency, Nocturia, Painful Urination, Pelvic Pain and Urgency. Musculoskeletal Present- Back Pain. Not Present- Joint Pain, Joint Stiffness, Muscle Pain, Muscle Weakness and Swelling of Extremities. Neurological Present- Numbness. Not Present- Decreased Memory, Fainting, Headaches, Seizures, Tingling, Tremor, Trouble walking and Weakness. Psychiatric Present- Anxiety and Fearful. Not Present- Bipolar, Change in Sleep Pattern, Depression and Frequent crying. Endocrine Not Present- Cold Intolerance, Excessive Hunger, Hair Changes, Heat Intolerance, Hot flashes and New Diabetes. Hematology Present- Easy Bruising. Not Present- Excessive bleeding, Gland problems, HIV and Persistent  Infections.   Physical Exam Rolm Bookbinder MD; 06/14/2015 4:18 PM) General Mental Status-Alert. Orientation-Oriented X3. Chest and Lung Exam Chest and lung exam reveals -on auscultation, normal breath sounds, no adventitious sounds and normal vocal resonance. Breast Nipples-No Discharge. Breast Lump-No Palpable Breast Mass. Cardiovascular Cardiovascular examination reveals -normal heart sounds, regular  rate and rhythm with no murmurs. Lymphatic Head & Neck General Head & Neck Lymphatics: Bilateral - Description - Normal. Axillary General Axillary Region: Bilateral - Description - Normal. 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 we discussed all available therapies as well. I recommended that we not proceed with sentinel node biopsy due to pathology. we discussed surgical options including lumpectomy vs mastectomy. we discussed lumpectomy and possibility of radiotherapy and/or antiestrogen therapy. we also discussed mastectomy and possiblity of reconstruction with unlikely need for radiotherapy. I told her there is no benefit to mastectomy. I think she technically is good candidate for seed guided lumpectomy. we discussed risks including possbility of positive margins necessitating a second surgery. will proceed with lumpectomy once cardiology gives recommendations.

## 2015-07-10 NOTE — Anesthesia Procedure Notes (Signed)
Procedure Name: Intubation Date/Time: 07/10/2015 1:15 PM Performed by: Baxter Flattery Pre-anesthesia Checklist: Patient identified, Emergency Drugs available, Suction available and Patient being monitored Patient Re-evaluated:Patient Re-evaluated prior to inductionOxygen Delivery Method: Circle System Utilized Preoxygenation: Pre-oxygenation with 100% oxygen Intubation Type: IV induction, Rapid sequence and Cricoid Pressure applied Ventilation: Mask ventilation without difficulty Laryngoscope Size: Miller and 2 Grade View: Grade I Tube type: Oral Tube size: 7.0 mm Number of attempts: 1 Airway Equipment and Method: Stylet Placement Confirmation: ETT inserted through vocal cords under direct vision,  positive ETCO2 and breath sounds checked- equal and bilateral Secured at: 21 cm Tube secured with: Tape Dental Injury: Teeth and Oropharynx as per pre-operative assessment

## 2015-07-10 NOTE — Discharge Instructions (Signed)
[facility] °Office Phone Number [phone] ° °POST OP INSTRUCTIONS ° °Always review your discharge instruction sheet given to you by the facility where your surgery was performed. ° °IF YOU HAVE DISABILITY OR FAMILY LEAVE FORMS, YOU MUST BRING THEM TO THE OFFICE FOR PROCESSING.  DO NOT GIVE THEM TO YOUR DOCTOR. ° °1. A prescription for pain medication may be given to you upon discharge.  Take your pain medication as prescribed, if needed.  If narcotic pain medicine is not needed, then you may take acetaminophen (Tylenol), naprosyn (Alleve) or ibuprofen (Advil) as needed. °2. Take your usually prescribed medications unless otherwise directed °3. If you need a refill on your pain medication, please contact your pharmacy.  They will contact our office to request authorization.  Prescriptions will not be filled after 5pm or on week-ends. °4. You should eat very light the first 24 hours after surgery, such as soup, crackers, pudding, etc.  Resume your normal diet the day after surgery. °5. Most patients will experience some swelling and bruising in the breast.  Ice packs and a good support bra will help.  Wear the breast binder provided or a sports bra for 72 hours day and night.  After that wear a sports bra during the day until you return to the office. Swelling and bruising can take several days to resolve.  °6. It is common to experience some constipation if taking pain medication after surgery.  Increasing fluid intake and taking a stool softener will usually help or prevent this problem from occurring.  A mild laxative (Milk of Magnesia or Miralax) should be taken according to package directions if there are no bowel movements after 48 hours. °7. Unless discharge instructions indicate otherwise, you may remove your bandages 48 hours after surgery and you may shower at that time.  You may have steri-strips (small skin tapes) in place directly over the incision.  These strips should be left on the  skin for 7-10 days and will come off on their own.  If your surgeon used skin glue on the incision, you may shower in 24 hours.  The glue will flake off over the next 2-3 weeks.  Any sutures or staples will be removed at the office during your follow-up visit. °8. ACTIVITIES:  You may resume regular daily activities (gradually increasing) beginning the next day.  Wearing a good support bra or sports bra minimizes pain and swelling.  You may have sexual intercourse when it is comfortable. °a. You may drive when you no longer are taking prescription pain medication, you can comfortably wear a seatbelt, and you can safely maneuver your car and apply brakes. °b. RETURN TO WORK:  ______________________________________________________________________________________ °9. You should see your doctor in the office for a follow-up appointment approximately two weeks after your surgery.  Your doctor’s nurse will typically make your follow-up appointment when she calls you with your pathology report.  Expect your pathology report 3-4 business days after your surgery.  You may call to check if you do not hear from us after three days. °10. OTHER INSTRUCTIONS: _______________________________________________________________________________________________ _____________________________________________________________________________________________________________________________________ °_____________________________________________________________________________________________________________________________________ °_____________________________________________________________________________________________________________________________________ ° °WHEN TO CALL DR WAKEFIELD: °1. Fever over 101.0 °2. Nausea and/or vomiting. °3. Extreme swelling or bruising. °4. Continued bleeding from incision. °5. Increased pain, redness, or drainage from the incision. ° °The clinic staff is available to answer your questions during regular  business hours.  Please don’t hesitate to call and ask to speak to one of the nurses for clinical concerns.  If   you have a medical emergency, go to the nearest emergency room or call 911.  A surgeon from [facility] is always on call at the hospital. ° °For further questions, please visit [email or web address] ° ° ° °Post Anesthesia Home Care Instructions ° °Activity: °Get plenty of rest for the remainder of the day. A responsible adult should stay with you for 24 hours following the procedure.  °For the next 24 hours, DO NOT: °-Drive a car °-Operate machinery °-Drink alcoholic beverages °-Take any medication unless instructed by your physician °-Make any legal decisions or sign important papers. ° °Meals: °Start with liquid foods such as gelatin or soup. Progress to regular foods as tolerated. Avoid greasy, spicy, heavy foods. If nausea and/or vomiting occur, drink only clear liquids until the nausea and/or vomiting subsides. Call your physician if vomiting continues. ° °Special Instructions/Symptoms: °Your throat may feel dry or sore from the anesthesia or the breathing tube placed in your throat during surgery. If this causes discomfort, gargle with warm salt water. The discomfort should disappear within 24 hours. ° °If you had a scopolamine patch placed behind your ear for the management of post- operative nausea and/or vomiting: ° °1. The medication in the patch is effective for 72 hours, after which it should be removed.  Wrap patch in a tissue and discard in the trash. Wash hands thoroughly with soap and water. °2. You may remove the patch earlier than 72 hours if you experience unpleasant side effects which may include dry mouth, dizziness or visual disturbances. °3. Avoid touching the patch. Wash your hands with soap and water after contact with the patch. °  ° °

## 2015-07-11 ENCOUNTER — Encounter (HOSPITAL_BASED_OUTPATIENT_CLINIC_OR_DEPARTMENT_OTHER): Payer: Self-pay | Admitting: General Surgery

## 2015-07-18 NOTE — Assessment & Plan Note (Signed)
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 I discussed the margins as well as lymph node surgeries. We also discussed the final staging along with previously performed ER/PR  Testing.  Recommendation: 1. Followed by adjuvant radiation therapy 2. Followed by antiestrogen therapy with tamoxifen 5 years  RTC in 3 months to start anti-estrogen therapy

## 2015-07-19 ENCOUNTER — Telehealth: Payer: Self-pay | Admitting: *Deleted

## 2015-07-19 ENCOUNTER — Encounter: Payer: Self-pay | Admitting: Hematology and Oncology

## 2015-07-19 ENCOUNTER — Ambulatory Visit (HOSPITAL_BASED_OUTPATIENT_CLINIC_OR_DEPARTMENT_OTHER): Payer: Medicare Other | Admitting: Hematology and Oncology

## 2015-07-19 VITALS — BP 154/61 | HR 64 | Temp 98.1°F | Resp 18 | Ht 68.0 in | Wt 178.8 lb

## 2015-07-19 DIAGNOSIS — D0511 Intraductal carcinoma in situ of right breast: Secondary | ICD-10-CM

## 2015-07-19 DIAGNOSIS — Z170 Estrogen receptor positive status [ER+]: Secondary | ICD-10-CM

## 2015-07-19 DIAGNOSIS — C50411 Malignant neoplasm of upper-outer quadrant of right female breast: Secondary | ICD-10-CM

## 2015-07-19 MED ORDER — TAMOXIFEN CITRATE 20 MG PO TABS: 20.0000 mg | ORAL_TABLET | Freq: Every day | ORAL | Status: DC

## 2015-07-19 NOTE — Telephone Encounter (Signed)
Called and set up appt for radiation on 2/7 at 10:30  At [facility].

## 2015-07-19 NOTE — Progress Notes (Signed)
Patient Care Team: Archer Asa. Colin Rhein, MD as PCP - General (Family Medicine) Rolm Bookbinder, MD as Consulting Physician (General Surgery) Nicholas Lose, MD as Consulting Physician (Hematology and Oncology) Eppie Gibson, MD as Attending Physician (Radiation Oncology)  DIAGNOSIS: Breast cancer of upper-outer quadrant of right female breast [facility])   Staging form: Breast, AJCC 7th Edition     Clinical stage from 06/13/2015: Stage 0 (Tis (DCIS), N0, M0) - Unsigned  SUMMARY OF ONCOLOGIC HISTORY:   Breast cancer of upper-outer quadrant of right female breast ([place])   05/21/2015 Mammogram Right breast suspicious calcifications spanning approximately 2.3 cm mid to posterior depth, upper outer quadrant   06/04/2015 Initial Diagnosis Right breast biopsy: DCIS with calcifications, ALH, fibrocystic changes, UV edge, ER 100%, PR 90%, low-grade, Tis N0 stage 0   07/10/2015 Surgery Rt Lumpectomy: 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 [name] and is here today to discuss the pathology report. She reports that she feels pretty good without any major pain or discomfort. She wishes to pursue radiation at [facility]. She did not have any major problems or concerns since his surgery.  REVIEW OF SYSTEMS:   Constitutional: Denies fevers, chills or abnormal weight loss Eyes: Denies blurriness of vision Ears, nose, mouth, throat, and face: Denies mucositis or sore throat Respiratory: Denies cough, dyspnea or wheezes Cardiovascular: Denies palpitation, chest discomfort Gastrointestinal:  Denies nausea, heartburn or change in bowel habits Skin: Denies abnormal skin rashes Lymphatics: Denies new lymphadenopathy or easy bruising Neurological:Denies numbness, tingling or new  weaknesses Behavioral/Psych: Mood is stable, no new changes  Extremities: No lower extremity edema Breast: recent breast surgery All other systems were reviewed with the patient and are negative.  I have reviewed the past medical history, past surgical history, social history and family history with the patient and they are unchanged from previous note.  ALLERGIES:  has No Known Allergies.  MEDICATIONS:  Current Outpatient Prescriptions  Medication Sig Dispense Refill  . aspirin EC 81 MG tablet Take 81 mg by mouth.    Marland Kitchen atorvastatin (LIPITOR) 40 MG tablet Take 40 mg by mouth.    . Calcium Carb-Cholecalciferol (CALCIUM 600/VITAMIN D3) 600-800 MG-UNIT TABS Take 1 tablet by mouth 2 (two) times daily.     . Cholecalciferol (VITAMIN D-1000 MAX ST) 1000 UNITS tablet Take 2,000 Units by mouth.    . flecainide (TAMBOCOR) 100 MG tablet Take 100 mg by mouth.    . metoprolol tartrate (LOPRESSOR) 25 MG tablet Take 12.5 mg by mouth 2 (two) times daily.     . Multiple Vitamin (MULTIVITAMIN) capsule Take 1 capsule by mouth.    . Rivaroxaban (XARELTO) 15 MG TABS tablet Take 15 mg by mouth.     No current facility-administered medications for this visit.    PHYSICAL EXAMINATION: ECOG PERFORMANCE STATUS: 1 - Symptomatic but completely ambulatory  Filed Vitals:   07/19/15 1433  BP: 154/61  Pulse: 64  Temp: 98.1 F (36.7 C)  Resp: 18   Filed Weights   07/19/15 1433  Weight: 178 lb 12.8 oz (81.103 kg)    GENERAL:alert, no distress and comfortable SKIN: skin color, texture, turgor are normal, no rashes or significant lesions EYES: normal, Conjunctiva are pink and non-injected, sclera clear OROPHARYNX:no exudate, no erythema and lips, buccal mucosa, and tongue normal  NECK:  supple, thyroid normal size, non-tender, without nodularity LYMPH:  no palpable lymphadenopathy in the cervical, axillary or inguinal LUNGS: clear to auscultation and percussion with normal breathing effort HEART: atrial  fibrillation ABDOMEN:abdomen soft, non-tender and normal bowel sounds MUSCULOSKELETAL:no cyanosis of digits and no clubbing  NEURO: alert & oriented x 3 with fluent speech, no focal motor/sensory deficits EXTREMITIES: No lower extremity edema   LABORATORY DATA:  I have reviewed the data as listed   Chemistry      Component Value Date/Time   NA 142 07/04/2015 1209   NA 141 06/13/2015 1219   K 4.5 07/04/2015 1209   K 4.0 06/13/2015 1219   CL 106 07/04/2015 1209   CO2 28 07/04/2015 1209   CO2 28 06/13/2015 1219   BUN 12 07/04/2015 1209   BUN 13.7 06/13/2015 1219   CREATININE 0.95 07/04/2015 1209   CREATININE 1.0 06/13/2015 1219      Component Value Date/Time   CALCIUM 9.3 07/04/2015 1209   CALCIUM 9.7 06/13/2015 1219   ALKPHOS 63 06/13/2015 1219   AST 19 06/13/2015 1219   ALT 12 06/13/2015 1219   BILITOT 0.47 06/13/2015 1219       Lab Results  Component Value Date   WBC 4.9 07/04/2015   HGB 12.1 07/04/2015   HCT 37.7 07/04/2015   MCV 91.7 07/04/2015   PLT 217 07/04/2015   NEUTROABS 2.5 07/04/2015     ASSESSMENT & PLAN:  Breast cancer of upper-outer quadrant of right female breast ([place]) 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 I discussed the margins as well as lymph node surgeries. We also discussed the final staging along with previously performed ER/PR  Testing.  Recommendation: 1. Followed by adjuvant radiation therapy: I will send a referral to [facility] for adjuvant radiation therapy. I showed her that this would be in her best interest to do radiation closer to home. She was very grateful for the care she received and is looking forward to [facility] to get her radiation.  2. Followed by antiestrogen therapy with tamoxifen 5 years I discussed the pros and cons of tamoxifen  therapy including the risk of hot flashes, myalgias, risk of blood clots as well as vaginal dryness and mood swings etc. Since she had a hysterectomy there are no risks from the uterus standpoint. I instructed her to start antiestrogen therapy about a 1 month after completion of radiation therapy.  Return to clinic 1 month after she starts antiestrogen therapy which would be around mid May 2017. She has plans to go to [place] on 11/20/2015.  No orders of the defined types were placed in this encounter.   The patient has a good understanding of the overall plan. she agrees with it. she will call with any problems that may develop before the next visit here.   Rulon Eisenmenger, MD 07/19/2015

## 2015-07-24 ENCOUNTER — Encounter: Payer: Self-pay | Admitting: *Deleted

## 2015-07-24 ENCOUNTER — Telehealth: Payer: Self-pay | Admitting: Hematology and Oncology

## 2015-07-24 NOTE — Telephone Encounter (Signed)
Spoke to patient about appointments for mid April. Patient aware

## 2015-07-24 NOTE — Progress Notes (Signed)
Ordered oncotype per Dr. Isidore Moos for DCIS.  Faxed PA to [facility] and faxed requisition to pathology and confirmed receipt.

## 2015-07-25 ENCOUNTER — Encounter: Payer: Self-pay | Admitting: *Deleted

## 2015-07-25 NOTE — Progress Notes (Signed)
Order for oncotype cancelled per Dr. Isidore Moos.  Patient will receive XRT in [facility] and sent HIM the information for a referral.

## 2015-07-27 ENCOUNTER — Other Ambulatory Visit: Payer: Self-pay | Admitting: General Surgery

## 2015-07-27 ENCOUNTER — Telehealth: Payer: Self-pay | Admitting: Hematology and Oncology

## 2015-07-27 DIAGNOSIS — D0511 Intraductal carcinoma in situ of right breast: Secondary | ICD-10-CM

## 2015-07-27 NOTE — Telephone Encounter (Signed)
Faxed path to [facility] for new pt referral [phone] release id)

## 2015-08-06 ENCOUNTER — Ambulatory Visit
Admission: RE | Admit: 2015-08-06 | Discharge: 2015-08-06 | Disposition: A | Discharge status: Home / Self Care | Payer: Medicare Other | Source: Ambulatory Visit | Attending: General Surgery | Admitting: General Surgery

## 2015-08-06 DIAGNOSIS — D0511 Intraductal carcinoma in situ of right breast: Secondary | ICD-10-CM

## 2015-08-23 ENCOUNTER — Telehealth: Payer: Self-pay | Admitting: *Deleted

## 2015-08-23 ENCOUNTER — Encounter: Payer: Self-pay | Admitting: *Deleted

## 2015-08-23 NOTE — Telephone Encounter (Signed)
Left vm for pt to return call to assess needs during xrt. Contact information provided. 

## 2015-09-26 ENCOUNTER — Telehealth: Payer: Self-pay | Admitting: *Deleted

## 2015-09-26 ENCOUNTER — Telehealth: Payer: Self-pay | Admitting: Nurse Practitioner

## 2015-09-26 ENCOUNTER — Other Ambulatory Visit: Payer: Self-pay | Admitting: *Deleted

## 2015-09-26 DIAGNOSIS — C50411 Malignant neoplasm of upper-outer quadrant of right female breast: Secondary | ICD-10-CM

## 2015-09-26 NOTE — Telephone Encounter (Signed)
Left message for a return phone call to follow up after radiation completion.  

## 2015-09-26 NOTE — Telephone Encounter (Signed)
cld & spoke to pt and gave pt time & date of appt 6/13 @1 :30

## 2015-10-04 ENCOUNTER — Telehealth: Payer: Self-pay

## 2015-10-04 NOTE — Telephone Encounter (Signed)
LMOVM - Need to reschedule appt on 4/18.  Pt to return call to clinic.

## 2015-10-16 ENCOUNTER — Ambulatory Visit: Payer: Medicare Other | Admitting: Hematology and Oncology

## 2015-10-24 ENCOUNTER — Telehealth: Payer: Self-pay | Admitting: Hematology and Oncology

## 2015-10-24 ENCOUNTER — Encounter: Payer: Self-pay | Admitting: Hematology and Oncology

## 2015-10-24 ENCOUNTER — Ambulatory Visit (HOSPITAL_BASED_OUTPATIENT_CLINIC_OR_DEPARTMENT_OTHER): Payer: Medicare Other | Admitting: Hematology and Oncology

## 2015-10-24 DIAGNOSIS — C50411 Malignant neoplasm of upper-outer quadrant of right female breast: Secondary | ICD-10-CM | POA: Diagnosis present

## 2015-10-24 MED ORDER — FLECAINIDE ACETATE 50 MG PO TABS: 50.0000 mg | ORAL_TABLET | Freq: Two times a day (BID) | ORAL | Status: AC

## 2015-10-24 NOTE — Progress Notes (Signed)
Unable to get in to exam room prior to MD.  No assessment performed.  

## 2015-10-24 NOTE — Telephone Encounter (Signed)
appt made and avs printed °

## 2015-10-24 NOTE — Progress Notes (Signed)
Patient Care Team: Archer Asa. Colin Rhein, MD as PCP - General (Family Medicine) Rolm Bookbinder, MD as Consulting Physician (General Surgery) Nicholas Lose, MD as Consulting Physician (Hematology and Oncology) Eppie Gibson, MD as Attending Physician (Radiation Oncology)  DIAGNOSIS: Breast cancer of upper-outer quadrant of right female breast [facility])   Staging form: Breast, AJCC 7th Edition     Clinical stage from 06/13/2015: Stage 0 (Tis (DCIS), N0, M0) - Unsigned   SUMMARY OF ONCOLOGIC HISTORY:   Breast cancer of upper-outer quadrant of right female breast ([place])   05/21/2015 Mammogram Right breast suspicious calcifications spanning approximately 2.3 cm mid to posterior depth, upper outer quadrant   06/04/2015 Initial Diagnosis Right breast biopsy: DCIS with calcifications, ALH, fibrocystic changes, UV edge, ER 100%, PR 90%, low-grade, Tis N0 stage 0   07/10/2015 Surgery Rt Lumpectomy: 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 She is here today to discuss the subsequent treatment plan. She had done quite well for the radiation standpoint. She does have minimal discomfort in the breast. She is healing very well from it.  REVIEW OF SYSTEMS:   Constitutional: Denies fevers, chills or abnormal weight loss Eyes: Denies blurriness of vision Ears, nose, mouth, throat, and face: Denies mucositis or sore throat Respiratory: Denies cough, dyspnea or wheezes Cardiovascular: Denies palpitation, chest discomfort Gastrointestinal:  Denies nausea, heartburn or change in bowel habits Skin: Denies abnormal skin rashes Lymphatics: Denies new lymphadenopathy or easy bruising Neurological:Denies numbness, tingling or new  weaknesses Behavioral/Psych: Mood is stable, no new changes  Extremities: No lower extremity edema Breast: Completed radiation therapy recently. All other systems were reviewed with the patient and are negative.  I have reviewed the past medical history, past surgical history, social history and family history with the patient and they are unchanged from previous note.  ALLERGIES:  has No Known Allergies.  MEDICATIONS:  Current Outpatient Prescriptions  Medication Sig Dispense Refill  . aspirin EC 81 MG tablet Take 81 mg by mouth.    Marland Kitchen atorvastatin (LIPITOR) 40 MG tablet Take 40 mg by mouth.    . Calcium Carb-Cholecalciferol (CALCIUM 600/VITAMIN D3) 600-800 MG-UNIT TABS Take 1 tablet by mouth 2 (two) times daily.     . Cholecalciferol (VITAMIN D-1000 MAX ST) 1000 UNITS tablet Take 2,000 Units by mouth.    . flecainide (TAMBOCOR) 50 MG tablet Take 1 tablet (50 mg total) by mouth 2 (two) times daily.    . metoprolol tartrate (LOPRESSOR) 25 MG tablet Take 12.5 mg by mouth 2 (two) times daily.     . Multiple Vitamin (MULTIVITAMIN) capsule Take 1 capsule by mouth.    . Rivaroxaban (XARELTO) 15 MG TABS tablet Take 15 mg by mouth.    . tamoxifen (NOLVADEX) 20 MG tablet Take 1 tablet (20 mg total) by mouth daily. 90 tablet 3   No current facility-administered medications for this visit.    PHYSICAL EXAMINATION: ECOG PERFORMANCE STATUS: 1 - Symptomatic but completely ambulatory  Filed Vitals:   10/24/15 1331  BP: 139/67  Pulse: 72  Temp: 98.2 F (36.8 C)  Resp: 18   Filed Weights   10/24/15 1331  Weight: 179 lb (81.194 kg)    GENERAL:alert, no distress and comfortable SKIN: skin color, texture, turgor  are normal, no rashes or significant lesions EYES: normal, Conjunctiva are pink and non-injected, sclera clear OROPHARYNX:no exudate, no erythema and lips, buccal mucosa, and tongue normal  NECK: supple, thyroid normal size, non-tender, without nodularity LYMPH:  no palpable  lymphadenopathy in the cervical, axillary or inguinal LUNGS: clear to auscultation and percussion with normal breathing effort HEART: regular rate & rhythm and no murmurs and no lower extremity edema ABDOMEN:abdomen soft, non-tender and normal bowel sounds MUSCULOSKELETAL:no cyanosis of digits and no clubbing  NEURO: alert & oriented x 3 with fluent speech, no focal motor/sensory deficits EXTREMITIES: No lower extremity edema  LABORATORY DATA:  I have reviewed the data as listed   Chemistry      Component Value Date/Time   NA 142 07/04/2015 1209   NA 141 06/13/2015 1219   K 4.5 07/04/2015 1209   K 4.0 06/13/2015 1219   CL 106 07/04/2015 1209   CO2 28 07/04/2015 1209   CO2 28 06/13/2015 1219   BUN 12 07/04/2015 1209   BUN 13.7 06/13/2015 1219   CREATININE 0.95 07/04/2015 1209   CREATININE 1.0 06/13/2015 1219      Component Value Date/Time   CALCIUM 9.3 07/04/2015 1209   CALCIUM 9.7 06/13/2015 1219   ALKPHOS 63 06/13/2015 1219   AST 19 06/13/2015 1219   ALT 12 06/13/2015 1219   BILITOT 0.47 06/13/2015 1219       Lab Results  Component Value Date   WBC 4.9 07/04/2015   HGB 12.1 07/04/2015   HCT 37.7 07/04/2015   MCV 91.7 07/04/2015   PLT 217 07/04/2015   NEUTROABS 2.5 07/04/2015     ASSESSMENT & PLAN:  Breast cancer of upper-outer quadrant of right female breast ([place]) 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 These include but not limited to insomnia, hot flashes, mood changes, vaginal dryness, and weight gain. Although rare, serious side effects including endometrial cancer, risk of blood clots were also discussed. We strongly believe that the benefits far outweigh the risks. Patient understands these risks and  consented to starting treatment. Planned treatment duration is 5 years.  Return to clinic in 3 months for toxicity check and follow-up      Orders Placed This Encounter  Procedures  . MM DIAG BREAST TOMO BILATERAL    EPIC ORDER PF: 05/09/2015 BCG NO NEEDS  CR/VONTA  MEDICARE  HX BR CA     Standing Status: Future     Number of Occurrences:      Standing Expiration Date: 12/23/2016    Order Specific Question:  Reason for Exam (SYMPTOM  OR DIAGNOSIS REQUIRED)    Answer:  Annual mamogram Rt Breast cancer    Order Specific Question:  Preferred imaging location?    Answer:  [facility]   The patient has a good understanding of the overall plan. she agrees with it. she will call with any problems that may develop before the next visit here.   Rulon Eisenmenger, MD 10/24/2015

## 2015-10-24 NOTE — Assessment & Plan Note (Signed)
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 These include but not limited to insomnia, hot flashes, mood changes, vaginal dryness, and weight gain. Although rare, serious side effects including endometrial cancer, risk of blood clots were also discussed. We strongly believe that the benefits far outweigh the risks. Patient understands these risks and consented to starting treatment. Planned treatment duration is 5 years.  Return to clinic in 3 months for toxicity check and follow-up

## 2015-10-28 ENCOUNTER — Telehealth: Payer: Self-pay | Admitting: Hematology and Oncology

## 2015-10-28 NOTE — Telephone Encounter (Signed)
lvm for pt regading to 7.13 appt moved to 7.18 due to md on call...a.dvised pt to call back to r/s survivorship due to comming from out of town. mailed pt appt sched and letter

## 2015-10-29 ENCOUNTER — Telehealth: Payer: Self-pay | Admitting: Hematology and Oncology

## 2015-10-29 NOTE — Telephone Encounter (Signed)
pt called to confirm appt....pt ok and aware of d.t of all appts

## 2016-01-03 ENCOUNTER — Telehealth: Payer: Self-pay | Admitting: Adult Health

## 2016-01-03 NOTE — Telephone Encounter (Signed)
I received an email from Ottis Stain that the patient had called her wanting to confirm her "survivorship class" appt for 01/10/16.    I returned Ms. Kolbeck phone call and let her know that we do have her scheduled to see me for a survivorship clinic visit on Thursday, 01/10/16 at [place].  She asked if we had other availability as it takes her about 1 hour to get to [place] from her home in [place].  I let her know that I could reschedule her appt to later in the month, but she preferred to keep her appt as is.  I gave her a brief introduction to survivorship and the purpose of the visit.  I gave her my direct office number to call me with any further questions or concerns.  I look forward to participating in her care!  Mike Craze, NP [place] [phone]

## 2016-01-10 ENCOUNTER — Ambulatory Visit: Payer: Medicare Other | Admitting: Hematology and Oncology

## 2016-01-10 ENCOUNTER — Ambulatory Visit (HOSPITAL_BASED_OUTPATIENT_CLINIC_OR_DEPARTMENT_OTHER): Payer: Medicare Other | Admitting: Adult Health

## 2016-01-10 VITALS — BP 108/96 | HR 65 | Temp 98.2°F | Resp 18 | Ht 68.0 in | Wt 179.8 lb

## 2016-01-10 DIAGNOSIS — Z79810 Long term (current) use of selective estrogen receptor modulators (SERMs): Secondary | ICD-10-CM

## 2016-01-10 DIAGNOSIS — Z170 Estrogen receptor positive status [ER+]: Secondary | ICD-10-CM

## 2016-01-10 DIAGNOSIS — D0511 Intraductal carcinoma in situ of right breast: Secondary | ICD-10-CM

## 2016-01-10 DIAGNOSIS — C50411 Malignant neoplasm of upper-outer quadrant of right female breast: Secondary | ICD-10-CM

## 2016-01-10 NOTE — Patient Instructions (Signed)
Thank you so much for coming in today!  I enjoyed meeting with you!  Please feel free to call me with any questions or concerns!  Mike Craze, NP [place] [phone]

## 2016-01-10 NOTE — Progress Notes (Signed)
CLINIC:  Survivorship   REASON FOR VISIT:  Routine follow-up post-treatment for a recent history of breast cancer.  BRIEF ONCOLOGIC HISTORY:    Breast cancer of upper-outer quadrant of right female breast ([place])   05/21/2015 Mammogram Right breast suspicious calcifications spanning approximately 2.3 cm mid to posterior depth, upper outer quadrant   06/04/2015 Initial Diagnosis Right breast biopsy: DCIS with calcifications, ALH, fibrocystic changes, UV edge, ER 100%, PR 90%, low-grade, Tis N0 stage 0   07/10/2015 Surgery Rt Lumpectomy [name]): DCIS 2.6 cm, ER 100%, PR 100%, margins Negative TisN0 Stage 0   08/27/2015 - 09/28/2015 Radiation Therapy Adj XRT at [facility], [place] with Dr. Dava Najjar. Left breast: 50 Gy in 25 fractions. Left breast boost: 10 Gy in 5 fractions.    10/28/2015 -  Anti-estrogen oral therapy Tamoxifen 20 mg daily. Planned duration of treament: 5 years [name])     INTERVAL HISTORY:  Evelyn Scott presents to the [facility] today for our initial meeting to review her survivorship care plan detailing her treatment course for breast cancer, as well as monitoring long-term side effects of that treatment, education regarding health maintenance, screening, and overall wellness and health promotion.     Overall, Evelyn Scott reports feeling quite well since completing her radiation therapy about 3.5 months ago.  She has occasional "pin prick" breast pain, which is intermittent and self-limiting. She started Tamoxifen in 09/2015 and is tolerating it relatively well.  She experiences hot flashes, but they are not severe and only happen about 2x/day.  She and her husband have been on a walking regimen together for about 45-90 minutes 6 days/week.  Otherwise, she is largely without complaints.    REVIEW OF SYSTEMS:  Review of Systems  Constitutional: Negative for malaise/fatigue.  HENT:       Wears dentures  Eyes: Negative for blurred vision and double vision.       Wears  glasses  Respiratory: Negative for cough and shortness of breath.   Cardiovascular: Negative for leg swelling.       Reports irregular heartbeat at times   Gastrointestinal: Negative for diarrhea, constipation and blood in stool.  Genitourinary: Negative for dysuria.  Musculoskeletal: Negative for myalgias.  Neurological: Negative for dizziness and headaches.  Endo/Heme/Allergies:       -Thyroid being monitored by endocrinologist -Endorses hot flashes   Psychiatric/Behavioral: Negative for depression.  Breast: Denies any new nodularity, masses, nipple changes, or nipple discharge.    A 14-point review of systems was completed and was negative, except as noted above.   ONCOLOGY TREATMENT TEAM:  1. Surgeon:  Dr. [name] at [facility] 2. Medical Oncologist: Dr. [name] 3. Radiation Oncologist: Dr. Dava Najjar in [place], [place].     PAST MEDICAL/SURGICAL HISTORY:  Past Medical History  Diagnosis Date  . Breast cancer of upper-outer quadrant of right female breast ([place]) 06/05/2015  . Hypertension   . Atrial fibrillation ([place])   . Diverticulosis of colon   . Goiter   . Osteopenia   . Carotid bruit   . ASD (atrial septal defect)   . Anxiety   . Dysrhythmia     a-fib- on xalreto, had cardioversion sept 2016 now NSR  . PONV (postoperative nausea and vomiting)    Past Surgical History  Procedure Laterality Date  . Abdominal hysterectomy    . Asd repair    . Varicose vein surgery Left   . Bunionectomy Bilateral   . Breast lumpectomy with radioactive seed localization Right 07/10/2015  Procedure: BREAST LUMPECTOMY WITH RADIOACTIVE SEED LOCALIZATION;  Surgeon: Emelia Loron, MD;  Location: [facility];  Service: General;  Laterality: Right;     ALLERGIES:  No Known Allergies   CURRENT MEDICATIONS:  Current Outpatient Prescriptions on File Prior to Visit  Medication Sig Dispense Refill  . aspirin EC 81 MG tablet Take 81 mg by mouth.    Marland Kitchen  atorvastatin (LIPITOR) 40 MG tablet Take 40 mg by mouth.    . Calcium Carb-Cholecalciferol (CALCIUM 600/VITAMIN D3) 600-800 MG-UNIT TABS Take 1 tablet by mouth 2 (two) times daily.     . Cholecalciferol (VITAMIN D-1000 MAX ST) 1000 UNITS tablet Take 2,000 Units by mouth.    . flecainide (TAMBOCOR) 50 MG tablet Take 1 tablet (50 mg total) by mouth 2 (two) times daily.    . metoprolol tartrate (LOPRESSOR) 25 MG tablet Take 12.5 mg by mouth 2 (two) times daily.     . Multiple Vitamin (MULTIVITAMIN) capsule Take 1 capsule by mouth.    . Rivaroxaban (XARELTO) 15 MG TABS tablet Take 15 mg by mouth.    . tamoxifen (NOLVADEX) 20 MG tablet Take 1 tablet (20 mg total) by mouth daily. 90 tablet 3   No current facility-administered medications on file prior to visit.     ONCOLOGIC FAMILY HISTORY:  No family history on file.   GENETIC COUNSELING/TESTING: None  SOCIAL HISTORY:  Evelyn Scott is married and lives with her husband in [place], [place].  They have been married for over 50 years.  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  She is a former smoker; quit in 1981. She drinks alcohol. She does not use illicit drugs.    PHYSICAL EXAMINATION:  Vital Signs:   Filed Vitals:   01/10/16 0849  BP: 108/96  Pulse: 65  Temp: 98.2 F (36.8 C)  Resp: 18   Filed Weights   01/10/16 0849  Weight: 179 lb 12.8 oz (81.557 kg)   General: Well-nourished, well-appearing female in no acute distress.  She is unaccompanied in clinic by her husband today.   HEENT: Head normocephalic.  Pupils equal and reactive to light and accomodation. Conjunctivae clear without exudate.  Sclerae anicteric. Oral mucosa is pink, moist.  Oropharynx is pink without lesions or erythema.  Lymph: No cervical, supraclavicular, or infraclavicular lymphadenopathy noted on palpation.  Cardiovascular: Regular rate and rhythm.Marland Kitchen Respiratory: Clear to  auscultation bilaterally. Chest expansion symmetric; breathing non-labored.  GI: Abdomen soft and round; non-tender, non-distended. Bowel sounds normoactive.  GU: Deferred.  Neuro: No focal deficits. Steady gait.  Psych: Mood and affect normal and appropriate for situation.  Extremities: No edema. Skin: Warm and dry.  LABORATORY DATA:  None for this visit.  DIAGNOSTIC IMAGING:  None for this visit.      ASSESSMENT AND PLAN:  Ms.. Scott is a pleasant 75 y.o. female with Stage 0 DCIS, diagnosed in 05/2015, treated with lumpectomy, adjuvant radiation therapy, and anti-estrogen therapy with Tamoxifen.  She presents to the Survivorship Clinic for our initial meeting and routine follow-up post-completion of treatment for breast cancer.    1. Stage 0 right breast cancer:  Evelyn Scott is continuing to recover from definitive treatment for right breast DCIS.  She will follow-up with her medical oncologist, Dr. Pamelia Hoit on 01/15/16 with history and physical exam per surveillance protocol.  She will continue her anti-estrogen therapy with Tamoxifen as prescribed by Dr. Pamelia Hoit. She was instructed to make  Dr. [name] or myself aware if she begins to experience any worrisome side effects of the medication and I could see her back in clinic to help manage those side effects, as needed. She has had a hysterectomy, so she has no risk of endometrial cancer as it relates to her Tamoxifen.  Other side effects of Tamoxifen were again reviewed with her as well. A comprehensive survivorship care plan and treatment summary was reviewed with the patient today detailing her breast cancer diagnosis, treatment course, potential late/long-term effects of treatment, appropriate follow-up care with recommendations for the future, and patient education resources.  A copy of this summary, along with a letter will be sent to the patient's primary care provider via mail/fax/In Basket message after today's visit.  Evelyn Scott also asked  that I share a coy with Dr. Orvil Feil, her radiation oncologist in [place], which I will do.   2. Hot flashes:  Currently her hot flashes are manageable without pharmacologic intervention.  We discussed different options, including gabapentin or Effexor, if her symptoms worsen.  She prefers to manage the hot flashes conservatively for now, as they are not very bothersome for her.    3. Bone health:  Given Evelyn Scott's age and history of breast cancer, she is at risk for bone demineralization.  Per our records, her last DEXA scan was 09/13/14 revealing osteopenia.  She was encouraged to increase her consumption of foods rich in calcium, as well as continue her walking regimen, as this will be beneficial weight-bearing exercise for her bone health.  We discussed that Tamoxifen may actually help mildly strengthen her bones, providing some benefit to patients diagnosed with breast cancer and osteopenia.  4. Cancer screening:  Due to Evelyn Scott's history and her age, she should receive screening for skin cancers, colon cancer, and gynecologic cancers.  Her last colonoscopy was in 2015 and was normal per her report. The information and recommendations are listed on the patient's comprehensive care plan/treatment summary and were reviewed in detail with the patient.    5. Health maintenance and wellness promotion: Evelyn Scott was encouraged to consume 5-7 servings of fruits and vegetables per day. We reviewed the "Nutrition Rainbow" handout, as well as the handout about "Nutrition for Breast Cancer Survivors."  She was also encouraged to engage in moderate to vigorous exercise for 30 minutes per day most days of the week. We discussed the [employer] fitness program, which is designed for cancer survivors to help them become more physically fit after cancer treatments.  She was instructed to limit her alcohol consumption and continue to abstain from tobacco use.   6. Support services/counseling: It is not  uncommon for this period of the patient's cancer care trajectory to be one of many emotions and stressors.  We discussed an opportunity for her to participate in the next session of [facility] ("Finding Your New Normal") support group series designed for patients after they have completed treatment.   Evelyn Scott was encouraged to take advantage of our many other support services programs, support groups, and/or counseling in coping with her new life as a cancer survivor after completing anti-cancer treatment.  She was offered support today through active listening and expressive supportive counseling.  She was given information regarding our available services and encouraged to contact me with any questions or for help enrolling in any of our support group/programs.    Dispo:   -Return to cancer center to see Dr. [name] on 01/15/16. -Mammogram 05/12/16 at [facility]  of [place].  -She is welcome to return back to the Survivorship Clinic at any time; no additional follow-up needed at this time.  -Consider referral back to survivorship as a long-term survivor for continued surveillance  A total of 50 minutes of face-to-face time was spent with this patient with greater than 50% of that time in counseling and care-coordination.   Mike Craze, NP Survivorship Program [place] [phone]   Note: PRIMARY CARE PROVIDER Iverson Alamin, MD None [phone]

## 2016-01-15 ENCOUNTER — Telehealth: Payer: Self-pay | Admitting: Hematology and Oncology

## 2016-01-15 ENCOUNTER — Ambulatory Visit (HOSPITAL_BASED_OUTPATIENT_CLINIC_OR_DEPARTMENT_OTHER): Payer: Medicare Other | Admitting: Hematology and Oncology

## 2016-01-15 ENCOUNTER — Encounter: Payer: Self-pay | Admitting: Hematology and Oncology

## 2016-01-15 VITALS — BP 124/68 | HR 62 | Temp 97.9°F | Resp 18 | Wt 179.7 lb

## 2016-01-15 DIAGNOSIS — Z170 Estrogen receptor positive status [ER+]: Secondary | ICD-10-CM | POA: Diagnosis not present

## 2016-01-15 DIAGNOSIS — C50411 Malignant neoplasm of upper-outer quadrant of right female breast: Secondary | ICD-10-CM | POA: Diagnosis present

## 2016-01-15 DIAGNOSIS — Z79810 Long term (current) use of selective estrogen receptor modulators (SERMs): Secondary | ICD-10-CM

## 2016-01-15 MED ORDER — CHOLECALCIFEROL 50 MCG (2000 UT) PO CAPS: 1.0000 | ORAL_CAPSULE | Freq: Every day | ORAL | Status: AC

## 2016-01-15 MED ORDER — THERA VITAL M PO TABS: 1.0000 | ORAL_TABLET | Freq: Every day | ORAL | Status: AC

## 2016-01-15 NOTE — Progress Notes (Signed)
Patient Care Team: Archer Asa. Colin Rhein, MD as PCP - General (Family Medicine) Rolm Bookbinder, MD as Consulting Physician (General Surgery) Nicholas Lose, MD as Consulting Physician (Hematology and Oncology) Eppie Gibson, MD as Attending Physician (Radiation Oncology)  DIAGNOSIS: Breast cancer of upper-outer quadrant of right female breast [facility])   Staging form: Breast, AJCC 7th Edition     Clinical stage from 06/13/2015: Stage 0 (Tis (DCIS), N0, M0) - Unsigned   SUMMARY OF ONCOLOGIC HISTORY:   Breast cancer of upper-outer quadrant of right female breast ([place])   05/21/2015 Mammogram Right breast suspicious calcifications spanning approximately 2.3 cm mid to posterior depth, upper outer quadrant   06/04/2015 Initial Diagnosis Right breast biopsy: DCIS with calcifications, ALH, fibrocystic changes, UV edge, ER 100%, PR 90%, low-grade, Tis N0 stage 0   07/10/2015 Surgery Rt Lumpectomy [name]): DCIS 2.6 cm, ER 100%, PR 100%, margins Negative TisN0 Stage 0   08/27/2015 - 09/28/2015 Radiation Therapy Adj XRT at [facility], [place] with Dr. Dava Najjar. Right breast: 50 Gy in 25 fractions. Right breast boost: 10 Gy in 5 fractions.    10/28/2015 -  Anti-estrogen oral therapy Tamoxifen 20 mg daily. Planned duration of treament: 5 years [name])     CHIEF COMPLIANT: Follow-up on tamoxifen  INTERVAL HISTORY: Evelyn Scott is a 75 year old with above-mentioned history right breast cancer currently on tamoxifen therapy and appears to be tolerating it fairly well. She does have hot flashes that happened 2-3 times a day but they only last for a minute or 2 and she can tolerate it. She denies any muscular aches or pains.  REVIEW OF SYSTEMS:   Constitutional: Denies fevers, chills or abnormal weight loss Eyes: Denies blurriness of vision Ears, nose, mouth, throat, and face: Denies mucositis or sore throat Respiratory: Denies cough, dyspnea or wheezes Cardiovascular: Denies palpitation, chest  discomfort Gastrointestinal:  Denies nausea, heartburn or change in bowel habits Skin: Denies abnormal skin rashes Lymphatics: Denies new lymphadenopathy or easy bruising Neurological:Denies numbness, tingling or new weaknesses Behavioral/Psych: Mood is stable, no new changes  Extremities: No lower extremity edema Breast:  denies any pain or lumps or nodules in either breasts All other systems were reviewed with the patient and are negative.  I have reviewed the past medical history, past surgical history, social history and family history with the patient and they are unchanged from previous note.  ALLERGIES:  has No Known Allergies.  MEDICATIONS:  Current Outpatient Prescriptions  Medication Sig Dispense Refill  . aspirin EC 81 MG tablet Take 81 mg by mouth.    Marland Kitchen atorvastatin (LIPITOR) 40 MG tablet Take 40 mg by mouth.    . Calcium Carb-Cholecalciferol (CALCIUM 600/VITAMIN D3) 600-800 MG-UNIT TABS Take 1 tablet by mouth 2 (two) times daily.     . Cholecalciferol (VITAMIN D-1000 MAX ST) 1000 UNITS tablet Take 2,000 Units by mouth.    . flecainide (TAMBOCOR) 50 MG tablet Take 1 tablet (50 mg total) by mouth 2 (two) times daily.    . metoprolol tartrate (LOPRESSOR) 25 MG tablet Take 12.5 mg by mouth 2 (two) times daily.     . Multiple Vitamin (MULTIVITAMIN) capsule Take 1 capsule by mouth.    . Rivaroxaban (XARELTO) 15 MG TABS tablet Take 15 mg by mouth.    . tamoxifen (NOLVADEX) 20 MG tablet Take 1 tablet (20 mg total) by mouth daily. 90 tablet 3   No current facility-administered medications for this visit.    PHYSICAL EXAMINATION: ECOG PERFORMANCE STATUS: 1 - Symptomatic  but completely ambulatory  Filed Vitals:   01/15/16 1050  BP: 124/68  Pulse: 62  Temp: 97.9 F (36.6 C)  Resp: 18   Filed Weights   01/15/16 1050  Weight: 179 lb 11.2 oz (81.511 kg)    GENERAL:alert, no distress and comfortable SKIN: skin color, texture, turgor are normal, no rashes or significant  lesions EYES: normal, Conjunctiva are pink and non-injected, sclera clear OROPHARYNX:no exudate, no erythema and lips, buccal mucosa, and tongue normal  NECK: supple, thyroid normal size, non-tender, without nodularity LYMPH:  no palpable lymphadenopathy in the cervical, axillary or inguinal LUNGS: clear to auscultation and percussion with normal breathing effort HEART: regular rate & rhythm and no murmurs and no lower extremity edema ABDOMEN:abdomen soft, non-tender and normal bowel sounds MUSCULOSKELETAL:no cyanosis of digits and no clubbing  NEURO: alert & oriented x 3 with fluent speech, no focal motor/sensory deficits EXTREMITIES: No lower extremity edema  LABORATORY DATA:  I have reviewed the data as listed   Chemistry      Component Value Date/Time   NA 142 07/04/2015 1209   NA 141 06/13/2015 1219   K 4.5 07/04/2015 1209   K 4.0 06/13/2015 1219   CL 106 07/04/2015 1209   CO2 28 07/04/2015 1209   CO2 28 06/13/2015 1219   BUN 12 07/04/2015 1209   BUN 13.7 06/13/2015 1219   CREATININE 0.95 07/04/2015 1209   CREATININE 1.0 06/13/2015 1219      Component Value Date/Time   CALCIUM 9.3 07/04/2015 1209   CALCIUM 9.7 06/13/2015 1219   ALKPHOS 63 06/13/2015 1219   AST 19 06/13/2015 1219   ALT 12 06/13/2015 1219   BILITOT 0.47 06/13/2015 1219       Lab Results  Component Value Date   WBC 4.9 07/04/2015   HGB 12.1 07/04/2015   HCT 37.7 07/04/2015   MCV 91.7 07/04/2015   PLT 217 07/04/2015   NEUTROABS 2.5 07/04/2015     ASSESSMENT & PLAN:  Breast cancer of upper-outer quadrant of right female breast ([place]) Rt Lumpectomy 07/10/15: DCIS 2.6 cm, ER 100%, PR 100%, margins Negative TisN0 Stage 0 Status post radiation therapy in [facility] completed 09/28/2015  Current treatment: Tamoxifen 20 mg daily started 10/28/2015  Tamoxifen toxicities:  Occasional hot flashes  Return to clinic in 6 months for follow-up for breast exams and surveillance   No  orders of the defined types were placed in this encounter.   The patient has a good understanding of the overall plan. she agrees with it. she will call with any problems that may develop before the next visit here.   Rulon Eisenmenger, MD 01/15/2016

## 2016-01-15 NOTE — Telephone Encounter (Signed)
appt made and avs printed °

## 2016-01-15 NOTE — Assessment & Plan Note (Signed)
Rt Lumpectomy 07/10/15: DCIS 2.6 cm, ER 100%, PR 100%, margins Negative TisN0 Stage 0 Status post radiation therapy in [facility] completed 09/28/2015  Recommendation: Tamoxifen 20 mg daily started 10/28/2015  Tamoxifen toxicities:   Return to clinic in 6 months for follow-up

## 2016-02-05 ENCOUNTER — Ambulatory Visit: Payer: Medicare Other | Admitting: Hematology and Oncology

## 2016-05-12 ENCOUNTER — Ambulatory Visit
Admission: RE | Admit: 2016-05-12 | Discharge: 2016-05-12 | Disposition: A | Discharge status: Home / Self Care | Payer: Medicare Other | Source: Ambulatory Visit | Attending: Hematology and Oncology | Admitting: Hematology and Oncology

## 2016-05-12 DIAGNOSIS — C50411 Malignant neoplasm of upper-outer quadrant of right female breast: Secondary | ICD-10-CM

## 2016-07-17 ENCOUNTER — Ambulatory Visit: Payer: Medicare Other | Admitting: Hematology and Oncology

## 2016-07-21 NOTE — Assessment & Plan Note (Signed)
Rt Lumpectomy 07/10/15: DCIS 2.6 cm, ER 100%, PR 100%, margins Negative TisN0 Stage 0 Status post radiation therapy in [facility] completed 09/28/2015  Current treatment: Tamoxifen 20 mg daily started 10/28/2015  Tamoxifen toxicities:  Occasional hot flashes  Return to clinic in 6 months for follow-up for breast exams and surveillance

## 2016-07-22 ENCOUNTER — Ambulatory Visit (HOSPITAL_BASED_OUTPATIENT_CLINIC_OR_DEPARTMENT_OTHER): Payer: Medicare Other | Admitting: Hematology and Oncology

## 2016-07-22 ENCOUNTER — Encounter: Payer: Self-pay | Admitting: Hematology and Oncology

## 2016-07-22 DIAGNOSIS — D0511 Intraductal carcinoma in situ of right breast: Secondary | ICD-10-CM | POA: Diagnosis present

## 2016-07-22 DIAGNOSIS — C50411 Malignant neoplasm of upper-outer quadrant of right female breast: Secondary | ICD-10-CM

## 2016-07-22 DIAGNOSIS — H9319 Tinnitus, unspecified ear: Secondary | ICD-10-CM

## 2016-07-22 DIAGNOSIS — Z170 Estrogen receptor positive status [ER+]: Secondary | ICD-10-CM

## 2016-07-22 DIAGNOSIS — Z79810 Long term (current) use of selective estrogen receptor modulators (SERMs): Secondary | ICD-10-CM | POA: Diagnosis not present

## 2016-07-22 NOTE — Progress Notes (Signed)
Patient Care Team: Archer Asa. Colin Rhein, MD as PCP - General (Family Medicine) Rolm Bookbinder, MD as Consulting Physician (General Surgery) Nicholas Lose, MD as Consulting Physician (Hematology and Oncology) Eppie Gibson, MD as Attending Physician (Radiation Oncology)  DIAGNOSIS:  Encounter Diagnosis  Name Primary?  . Malignant neoplasm of upper-outer quadrant of right breast in female, estrogen receptor positive ([place])     SUMMARY OF ONCOLOGIC HISTORY:   Breast cancer of upper-outer quadrant of right female breast ([place])   05/21/2015 Mammogram    Right breast suspicious calcifications spanning approximately 2.3 cm mid to posterior depth, upper outer quadrant      06/04/2015 Initial Diagnosis    Right breast biopsy: DCIS with calcifications, ALH, fibrocystic changes, UV edge, ER 100%, PR 90%, low-grade, Tis N0 stage 0      07/10/2015 Surgery    Rt Lumpectomy [name]): DCIS 2.6 cm, ER 100%, PR 100%, margins Negative TisN0 Stage 0      08/27/2015 - 09/28/2015 Radiation Therapy    Adj XRT at [facility], [place] with Dr. Dava Najjar. Right breast: 50 Gy in 25 fractions. Right breast boost: 10 Gy in 5 fractions.       10/26/2015 -  Anti-estrogen oral therapy    Tamoxifen 20 mg daily. Planned duration of treament: 5 years ([place])        CHIEF COMPLIANT: Follow-up on tamoxifen therapy complaining of tinnitus  INTERVAL HISTORY: Evelyn Scott is a 76 year old with above-mentioned history of right breast DCIS who underwent lumpectomy followed by adjuvant radiation and is currently on tamoxifen. She is tolerating tamoxifen extremely well. She is very occasional hot flashes that do not seem to be bothering her. She complains of tinnitus especially at nighttime she has noticed. She is seeing ENT and audiology. Her hearing appears to be fine.  REVIEW OF SYSTEMS:   Constitutional: Denies fevers, chills or abnormal weight loss Eyes: Denies blurriness of vision Ears, nose, mouth, throat,  and face: Denies mucositis or sore throat Respiratory: Denies cough, dyspnea or wheezes Cardiovascular: Denies palpitation, chest discomfort Gastrointestinal:  Denies nausea, heartburn or change in bowel habits Skin: Denies abnormal skin rashes Lymphatics: Denies new lymphadenopathy or easy bruising Neurological:Denies numbness, tingling or new weaknesses Behavioral/Psych: Mood is stable, no new changes  Extremities: No lower extremity edema Breast:  denies any pain or lumps or nodules in either breasts All other systems were reviewed with the patient and are negative.  I have reviewed the past medical history, past surgical history, social history and family history with the patient and they are unchanged from previous note.  ALLERGIES:  has No Known Allergies.  MEDICATIONS:  Current Outpatient Prescriptions  Medication Sig Dispense Refill  . aspirin EC 81 MG tablet Take 81 mg by mouth.    Marland Kitchen atorvastatin (LIPITOR) 40 MG tablet Take 40 mg by mouth.    . Calcium Carb-Cholecalciferol (CALCIUM 600/VITAMIN D3) 600-800 MG-UNIT TABS Take 1 tablet by mouth 2 (two) times daily.     . Cholecalciferol 2000 units CAPS Take 1 capsule (2,000 Units total) by mouth daily. 30 each   . flecainide (TAMBOCOR) 50 MG tablet Take 1 tablet (50 mg total) by mouth 2 (two) times daily.    . metoprolol tartrate (LOPRESSOR) 25 MG tablet Take 12.5 mg by mouth 2 (two) times daily.     . Multiple Vitamin (MULTIVITAMIN) capsule Take 1 capsule by mouth.    . Multiple Vitamins-Minerals (MULTIVITAMIN) tablet Take 1 tablet by mouth daily.    . Rivaroxaban (XARELTO)  15 MG TABS tablet Take 15 mg by mouth.    . tamoxifen (NOLVADEX) 20 MG tablet Take 1 tablet (20 mg total) by mouth daily. 90 tablet 3   No current facility-administered medications for this visit.     PHYSICAL EXAMINATION: ECOG PERFORMANCE STATUS: 1 - Symptomatic but completely ambulatory  Vitals:   07/22/16 1112  BP: 139/68  Pulse: 67  Resp: 17    Temp: 98.8 F (37.1 C)   Filed Weights   07/22/16 1112  Weight: 181 lb 9.6 oz (82.4 kg)    GENERAL:alert, no distress and comfortable SKIN: skin color, texture, turgor are normal, no rashes or significant lesions EYES: normal, Conjunctiva are pink and non-injected, sclera clear OROPHARYNX:no exudate, no erythema and lips, buccal mucosa, and tongue normal  NECK: supple, thyroid normal size, non-tender, without nodularity LYMPH:  no palpable lymphadenopathy in the cervical, axillary or inguinal LUNGS: clear to auscultation and percussion with normal breathing effort HEART: Atrial fibrillation ABDOMEN:abdomen soft, non-tender and normal bowel sounds MUSCULOSKELETAL:no cyanosis of digits and no clubbing  NEURO: alert & oriented x 3 with fluent speech, no focal motor/sensory deficits EXTREMITIES: No lower extremity edema BREAST: No palpable masses or nodules in either right or left breasts. No palpable axillary supraclavicular or infraclavicular adenopathy no breast tenderness or nipple discharge. (exam performed in the presence of a chaperone)  LABORATORY DATA:  I have reviewed the data as listed   Chemistry      Component Value Date/Time   NA 142 07/04/2015 1209   NA 141 06/13/2015 1219   K 4.5 07/04/2015 1209   K 4.0 06/13/2015 1219   CL 106 07/04/2015 1209   CO2 28 07/04/2015 1209   CO2 28 06/13/2015 1219   BUN 12 07/04/2015 1209   BUN 13.7 06/13/2015 1219   CREATININE 0.95 07/04/2015 1209   CREATININE 1.0 06/13/2015 1219      Component Value Date/Time   CALCIUM 9.3 07/04/2015 1209   CALCIUM 9.7 06/13/2015 1219   ALKPHOS 63 06/13/2015 1219   AST 19 06/13/2015 1219   ALT 12 06/13/2015 1219   BILITOT 0.47 06/13/2015 1219       Lab Results  Component Value Date   WBC 4.9 07/04/2015   HGB 12.1 07/04/2015   HCT 37.7 07/04/2015   MCV 91.7 07/04/2015   PLT 217 07/04/2015   NEUTROABS 2.5 07/04/2015    ASSESSMENT & PLAN:  Breast cancer of upper-outer quadrant of  right female breast ([place]) Rt Lumpectomy 07/10/15: DCIS 2.6 cm, ER 100%, PR 100%, margins Negative TisN0 Stage 0 Status post radiation therapy in [facility] completed 09/28/2015  Current treatment: Tamoxifen 20 mg daily started 10/28/2015  Tamoxifen toxicities:  Occasional hot flashes Tinnitus: Is unclear the cause of her tinnitus. She feels a hissing noise at nighttime. I instructed her to stop tamoxifen for 3 weeks. If her tinnitus goes away then we can switch her from tamoxifen to anastrozole. If her tinnitus does not go away then she will resume tamoxifen.  Patient will call us back in 3 weeks to inform us about her tinnitus situation. If she would like to resume tamoxifen we will need to send a new prescription to her pharmacy.  Breast Cancer Surveillance: 1. Breast exam 07/22/2016: Normal 2. Mammogram 05/12/2016 No abnormalities. Postsurgical changes. Breast Density Category C. I recommended that she get 3-D mammograms for surveillance. Discussed the differences between different breast density categories.    Return to clinic in 6 months for follow-up for breast exams  and surveillance   I spent 25 minutes talking to the patient of which more than half was spent in counseling and coordination of care.  No orders of the defined types were placed in this encounter.  The patient has a good understanding of the overall plan. she agrees with it. she will call with any problems that may develop before the next visit here.   Rulon Eisenmenger, MD 07/22/16
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
## 2016-08-12 ENCOUNTER — Telehealth: Payer: Self-pay | Admitting: Emergency Medicine

## 2016-08-12 NOTE — Telephone Encounter (Signed)
Patient called to report that she continues to have the hissing in her ears after being off the Tamoxifen for 3 weeks. Patient advised per Dr [name] that she is ok to restart the Tamoxifen now; which patient states she will restart on 08/13/16. Patient has seen both ENT and audiology for her hissing in her ears.  Advised patient to call for any concerns or questions.

## 2016-10-13 ENCOUNTER — Other Ambulatory Visit: Payer: Self-pay | Admitting: *Deleted

## 2016-10-13 DIAGNOSIS — C50411 Malignant neoplasm of upper-outer quadrant of right female breast: Secondary | ICD-10-CM

## 2016-10-13 DIAGNOSIS — Z170 Estrogen receptor positive status [ER+]: Principal | ICD-10-CM

## 2016-10-13 MED ORDER — TAMOXIFEN CITRATE 20 MG PO TABS: 20.0000 mg | ORAL_TABLET | Freq: Every day | ORAL | 3 refills | Status: DC

## 2016-10-15 ENCOUNTER — Telehealth: Payer: Self-pay | Admitting: Hematology and Oncology

## 2016-10-15 ENCOUNTER — Telehealth: Payer: Self-pay | Admitting: Emergency Medicine

## 2016-10-15 NOTE — Telephone Encounter (Signed)
Patient called and wanted [place] to know that her last presciption was sent to the wrong pharmacy.  She corrected it.  Wanted to make [place] aware of the issue.

## 2016-10-15 NOTE — Telephone Encounter (Signed)
Received voicemail, call from patient to after hours answering service and faxed refill request in regards to patients Tamoxifen refill; spoke to patient yesterday and advised her that refill was sent on 10/13/16 for 90 day supply and 3 refills. Called pharmacy today and they state that they had to order the medication and it should be in today or tomorrow 4/19. Per staff at [facility]; they did receive the refill request; states she will call patient and advise her that they are awaiting the medication to arrive in shipment. I also called patient to advise her of the same; left message on her voicemail stating that pharmacy was ordering medication.

## 2016-10-16 ENCOUNTER — Other Ambulatory Visit: Payer: Self-pay

## 2016-11-13 ENCOUNTER — Other Ambulatory Visit: Payer: Self-pay | Admitting: Family Medicine

## 2016-11-13 DIAGNOSIS — Z1231 Encounter for screening mammogram for malignant neoplasm of breast: Secondary | ICD-10-CM

## 2016-11-13 DIAGNOSIS — Z853 Personal history of malignant neoplasm of breast: Secondary | ICD-10-CM

## 2017-01-06 ENCOUNTER — Encounter: Payer: Self-pay | Admitting: Hematology and Oncology

## 2017-01-06 ENCOUNTER — Ambulatory Visit (HOSPITAL_BASED_OUTPATIENT_CLINIC_OR_DEPARTMENT_OTHER): Payer: Medicare Other | Admitting: Hematology and Oncology

## 2017-01-06 DIAGNOSIS — Z170 Estrogen receptor positive status [ER+]: Secondary | ICD-10-CM | POA: Diagnosis not present

## 2017-01-06 DIAGNOSIS — C50411 Malignant neoplasm of upper-outer quadrant of right female breast: Secondary | ICD-10-CM

## 2017-01-06 DIAGNOSIS — Z79810 Long term (current) use of selective estrogen receptor modulators (SERMs): Secondary | ICD-10-CM | POA: Diagnosis not present

## 2017-01-06 NOTE — Assessment & Plan Note (Addendum)
Rt Lumpectomy 07/10/15: DCIS 2.6 cm, ER 100%, PR 100%, margins Negative TisN0 Stage 0 Status post radiation therapy in [facility] completed 09/28/2015  Current treatment: Tamoxifen 20 mg daily started 10/28/2015  Tamoxifen toxicities:  Occasional hot flashes  Tinnitus: Is unclear the cause of her tinnitus. She feels a hissing noise at nighttime. I instructed her to stop tamoxifen for 3 weeks. If her tinnitus goes away then we can switch her from tamoxifen to anastrozole. If her tinnitus does not go away then she will resume tamoxifen.  Breast Cancer Surveillance: 1. Breast exam 01/06/2017: Normal 2. Mammogram 05/12/2016 No abnormalities. Postsurgical changes. Breast Density Category C. I recommended that she get 3-D mammograms for surveillance. Discussed the differences between different breast density categories.   Return to clinic in 1 year for follow-up

## 2017-01-06 NOTE — Progress Notes (Signed)
Patient Care Team: Lemar Livings., MD as PCP - General (Family Medicine) Rolm Bookbinder, MD as Consulting Physician (General Surgery) Nicholas Lose, MD as Consulting Physician (Hematology and Oncology) Eppie Gibson, MD as Attending Physician (Radiation Oncology)  DIAGNOSIS:  Encounter Diagnosis  Name Primary?  . Malignant neoplasm of upper-outer quadrant of right breast in female, estrogen receptor positive ([place])     SUMMARY OF ONCOLOGIC HISTORY:   Breast cancer of upper-outer quadrant of right female breast ([place])   05/21/2015 Mammogram    Right breast suspicious calcifications spanning approximately 2.3 cm mid to posterior depth, upper outer quadrant      06/04/2015 Initial Diagnosis    Right breast biopsy: DCIS with calcifications, ALH, fibrocystic changes, UV edge, ER 100%, PR 90%, low-grade, Tis N0 stage 0      07/10/2015 Surgery    Rt Lumpectomy [name]): DCIS 2.6 cm, ER 100%, PR 100%, margins Negative TisN0 Stage 0      08/27/2015 - 09/28/2015 Radiation Therapy    Adj XRT at [facility], [place] with Dr. Dava Najjar. Right breast: 50 Gy in 25 fractions. Right breast boost: 10 Gy in 5 fractions.       10/26/2015 -  Anti-estrogen oral therapy    Tamoxifen 20 mg daily. Planned duration of treament: 5 years [name])        CHIEF COMPLIANT: Follow-up on tamoxifen therapy  INTERVAL HISTORY: Evelyn Scott is a 76 year old lady who had right breast DCIS who underwent lumpectomy and radiation is currently on tamoxifen since April 2017. She is tolerating tamoxifen extremely well without any major problems. She has occasional hot flashes. She denies any lumps or nodules in the breasts.  REVIEW OF SYSTEMS:   Constitutional: Denies fevers, chills or abnormal weight loss Eyes: Denies blurriness of vision Ears, nose, mouth, throat, and face: Denies mucositis or sore throat Respiratory: Denies cough, dyspnea or wheezes Cardiovascular: Denies palpitation, chest  discomfort Gastrointestinal:  Denies nausea, heartburn or change in bowel habits Skin: Denies abnormal skin rashes Lymphatics: Denies new lymphadenopathy or easy bruising Neurological:Denies numbness, tingling or new weaknesses Behavioral/Psych: Mood is stable, no new changes  Extremities: No lower extremity edema Breast:  denies any pain or lumps or nodules in either breasts All other systems were reviewed with the patient and are negative.  I have reviewed the past medical history, past surgical history, social history and family history with the patient and they are unchanged from previous note.  ALLERGIES:  has No Known Allergies.  MEDICATIONS:  Current Outpatient Prescriptions  Medication Sig Dispense Refill  . aspirin EC 81 MG tablet Take 81 mg by mouth.    Marland Kitchen atorvastatin (LIPITOR) 40 MG tablet Take 40 mg by mouth.    . Calcium Carb-Cholecalciferol (CALCIUM 600/VITAMIN D3) 600-800 MG-UNIT TABS Take 1 tablet by mouth 2 (two) times daily.     . Cholecalciferol 2000 units CAPS Take 1 capsule (2,000 Units total) by mouth daily. 30 each   . flecainide (TAMBOCOR) 50 MG tablet Take 1 tablet (50 mg total) by mouth 2 (two) times daily.    . metoprolol tartrate (LOPRESSOR) 25 MG tablet Take 12.5 mg by mouth 2 (two) times daily.     . Multiple Vitamin (MULTIVITAMIN) capsule Take 1 capsule by mouth.    . Multiple Vitamins-Minerals (MULTIVITAMIN) tablet Take 1 tablet by mouth daily.    . Rivaroxaban (XARELTO) 15 MG TABS tablet Take 15 mg by mouth.    . tamoxifen (NOLVADEX) 20 MG tablet Take 1 tablet (20  mg total) by mouth daily. 90 tablet 3   No current facility-administered medications for this visit.     PHYSICAL EXAMINATION: ECOG PERFORMANCE STATUS: 1 - Symptomatic but completely ambulatory  Vitals:   01/06/17 1121  BP: (!) 124/59  Pulse: (!) 58  Resp: 18  Temp: 98 F (36.7 C)   Filed Weights   01/06/17 1121  Weight: 178 lb 1.6 oz (80.8 kg)    GENERAL:alert, no distress and  comfortable SKIN: skin color, texture, turgor are normal, no rashes or significant lesions EYES: normal, Conjunctiva are pink and non-injected, sclera clear OROPHARYNX:no exudate, no erythema and lips, buccal mucosa, and tongue normal  NECK: supple, thyroid normal size, non-tender, without nodularity LYMPH:  no palpable lymphadenopathy in the cervical, axillary or inguinal LUNGS: clear to auscultation and percussion with normal breathing effort HEART: regular rate & rhythm and no murmurs and no lower extremity edema ABDOMEN:abdomen soft, non-tender and normal bowel sounds MUSCULOSKELETAL:no cyanosis of digits and no clubbing  NEURO: alert & oriented x 3 with fluent speech, no focal motor/sensory deficits EXTREMITIES: No lower extremity edema BREAST: No palpable masses or nodules in either right or left breasts. No palpable axillary supraclavicular or infraclavicular adenopathy no breast tenderness or nipple discharge. (exam performed in the presence of a chaperone)  LABORATORY DATA:  I have reviewed the data as listed   Chemistry      Component Value Date/Time   NA 142 07/04/2015 1209   NA 141 06/13/2015 1219   K 4.5 07/04/2015 1209   K 4.0 06/13/2015 1219   CL 106 07/04/2015 1209   CO2 28 07/04/2015 1209   CO2 28 06/13/2015 1219   BUN 12 07/04/2015 1209   BUN 13.7 06/13/2015 1219   CREATININE 0.95 07/04/2015 1209   CREATININE 1.0 06/13/2015 1219      Component Value Date/Time   CALCIUM 9.3 07/04/2015 1209   CALCIUM 9.7 06/13/2015 1219   ALKPHOS 63 06/13/2015 1219   AST 19 06/13/2015 1219   ALT 12 06/13/2015 1219   BILITOT 0.47 06/13/2015 1219       Lab Results  Component Value Date   WBC 4.9 07/04/2015   HGB 12.1 07/04/2015   HCT 37.7 07/04/2015   MCV 91.7 07/04/2015   PLT 217 07/04/2015   NEUTROABS 2.5 07/04/2015    ASSESSMENT & PLAN:  Breast cancer of upper-outer quadrant of right female breast ([place]) Rt Lumpectomy 07/10/15: DCIS 2.6 cm, ER 100%, PR 100%,  margins Negative TisN0 Stage 0 Status post radiation therapy in [facility] completed 09/28/2015  Current treatment: Tamoxifen 20 mg daily started 10/28/2015  Tamoxifen toxicities:  Occasional hot flashes  Tinnitus:Unrelated to tamoxifen therapy. She continues to have this symptom.  Breast Cancer Surveillance: 1. Breast exam 01/06/2017: Normal 2. Mammogram 05/12/2016 No abnormalities. Postsurgical changes. Breast Density Category C. I recommended that she get 3-D mammograms for surveillance. Discussed the differences between different breast density categories.   Return to clinic in 1 year for follow-up   I spent 25 minutes talking to the patient of which more than half was spent in counseling and coordination of care.  No orders of the defined types were placed in this encounter.  The patient has a good understanding of the overall plan. she agrees with it. she will call with any problems that may develop before the next visit here.   Rulon Eisenmenger, MD 01/06/17

## 2017-05-18 ENCOUNTER — Ambulatory Visit
Admission: RE | Admit: 2017-05-18 | Discharge: 2017-05-18 | Disposition: A | Discharge status: Home / Self Care | Payer: Medicare Other | Source: Ambulatory Visit | Attending: Family Medicine | Admitting: Family Medicine

## 2017-05-18 DIAGNOSIS — Z853 Personal history of malignant neoplasm of breast: Secondary | ICD-10-CM

## 2017-10-08 ENCOUNTER — Other Ambulatory Visit: Payer: Self-pay | Admitting: Hematology and Oncology

## 2017-10-08 DIAGNOSIS — Z170 Estrogen receptor positive status [ER+]: Principal | ICD-10-CM

## 2017-10-08 DIAGNOSIS — C50411 Malignant neoplasm of upper-outer quadrant of right female breast: Secondary | ICD-10-CM

## 2017-10-09 ENCOUNTER — Other Ambulatory Visit: Payer: Self-pay | Admitting: *Deleted

## 2017-10-09 DIAGNOSIS — C50411 Malignant neoplasm of upper-outer quadrant of right female breast: Secondary | ICD-10-CM

## 2017-10-09 DIAGNOSIS — Z170 Estrogen receptor positive status [ER+]: Principal | ICD-10-CM

## 2017-10-09 MED ORDER — TAMOXIFEN CITRATE 20 MG PO TABS
20.0000 mg | ORAL_TABLET | Freq: Every day | ORAL | 3 refills | Status: DC
Start: 2017-10-09 — End: 2018-10-06

## 2017-10-14 ENCOUNTER — Other Ambulatory Visit: Payer: Self-pay

## 2018-01-06 ENCOUNTER — Inpatient Hospital Stay: Payer: Medicare Other | Attending: Hematology and Oncology | Admitting: Hematology and Oncology

## 2018-01-06 ENCOUNTER — Telehealth: Payer: Self-pay | Admitting: Hematology and Oncology

## 2018-01-06 DIAGNOSIS — Z79810 Long term (current) use of selective estrogen receptor modulators (SERMs): Secondary | ICD-10-CM | POA: Insufficient documentation

## 2018-01-06 DIAGNOSIS — Z170 Estrogen receptor positive status [ER+]: Secondary | ICD-10-CM | POA: Diagnosis not present

## 2018-01-06 DIAGNOSIS — C50411 Malignant neoplasm of upper-outer quadrant of right female breast: Secondary | ICD-10-CM

## 2018-01-06 NOTE — Progress Notes (Signed)
Patient Care Team: Lemar Livings., MD as PCP - General (Family Medicine) Rolm Bookbinder, MD as Consulting Physician (General Surgery) Nicholas Lose, MD as Consulting Physician (Hematology and Oncology) Eppie Gibson, MD as Attending Physician (Radiation Oncology)  DIAGNOSIS:  Encounter Diagnosis  Name Primary?  . Malignant neoplasm of upper-outer quadrant of right breast in female, estrogen receptor positive ([place])     SUMMARY OF ONCOLOGIC HISTORY:   Breast cancer of upper-outer quadrant of right female breast ([place])   05/21/2015 Mammogram    Right breast suspicious calcifications spanning approximately 2.3 cm mid to posterior depth, upper outer quadrant      06/04/2015 Initial Diagnosis    Right breast biopsy: DCIS with calcifications, ALH, fibrocystic changes, UV edge, ER 100%, PR 90%, low-grade, Tis N0 stage 0      07/10/2015 Surgery    Rt Lumpectomy Evelyn Scott): DCIS 2.6 cm, ER 100%, PR 100%, margins Negative TisN0 Stage 0      08/27/2015 - 09/28/2015 Radiation Therapy    Adj XRT at [facility], [place] with Dr. Dava Najjar. Right breast: 50 Gy in 25 fractions. Right breast boost: 10 Gy in 5 fractions.       10/26/2015 -  Anti-estrogen oral therapy    Tamoxifen 20 mg daily. Planned duration of treament: 5 years Evelyn Scott)        CHIEF COMPLIANT: Follow-up on tamoxifen therapy  INTERVAL HISTORY: Evelyn Scott is a 76-year with above-mentioned history of right breast IDC and is currently on tamoxifen.  She appears to be tolerating extremely well.  She started tamoxifen in April 2017.  She completes therapy by April 2021.  She denies any lumps or nodules in the breast.  Denies any hot flashes of significance.  She does have mild hot flashes.  REVIEW OF SYSTEMS:   Constitutional: Denies fevers, chills or abnormal weight loss Eyes: Denies blurriness of vision Ears, nose, mouth, throat, and face: Denies mucositis or sore throat Respiratory: Denies cough, dyspnea or  wheezes Cardiovascular: Denies palpitation, chest discomfort Gastrointestinal:  Denies nausea, heartburn or change in bowel habits Skin: Denies abnormal skin rashes Lymphatics: Denies new lymphadenopathy or easy bruising Neurological:Denies numbness, tingling or new weaknesses Behavioral/Psych: Mood is stable, no new changes  Extremities: No lower extremity edema Bre All other systems were reviewed with the patient and are negative.  I have reviewed the past medical history, past surgical history, social history and family history with the patient and they are unchanged from previous note.  ALLERGIES:  has No Known Allergies.  MEDICATIONS:  Current Outpatient Medications  Medication Sig Dispense Refill  . aspirin EC 81 MG tablet Take 81 mg by mouth.    Marland Kitchen atorvastatin (LIPITOR) 40 MG tablet Take 40 mg by mouth.    . Calcium Carb-Cholecalciferol (CALCIUM 600/VITAMIN D3) 600-800 MG-UNIT TABS Take 1 tablet by mouth 2 (two) times daily.     . Cholecalciferol 2000 units CAPS Take 1 capsule (2,000 Units total) by mouth daily. 30 each   . flecainide (TAMBOCOR) 50 MG tablet Take 1 tablet (50 mg total) by mouth 2 (two) times daily.    . metoprolol tartrate (LOPRESSOR) 25 MG tablet Take 12.5 mg by mouth 2 (two) times daily.     . Multiple Vitamin (MULTIVITAMIN) capsule Take 1 capsule by mouth.    . Multiple Vitamins-Minerals (MULTIVITAMIN) tablet Take 1 tablet by mouth daily.    . Rivaroxaban (XARELTO) 15 MG TABS tablet Take 15 mg by mouth.    . tamoxifen (NOLVADEX) 20 MG  tablet Take 1 tablet (20 mg total) by mouth daily. 90 tablet 3   No current facility-administered medications for this visit.     PHYSICAL EXAMINATION: ECOG PERFORMANCE STATUS: 1 - Symptomatic but completely ambulatory  Vitals:   01/06/18 1335  BP: 132/70  Pulse: 67  Resp: 17  Temp: 98.4 F (36.9 C)  SpO2: 99%   Filed Weights   01/06/18 1335  Weight: 178 lb 8 oz (81 kg)    GENERAL:alert, no distress and  comfortable SKIN: skin color, texture, turgor are normal, no rashes or significant lesions EYES: normal, Conjunctiva are pink and non-injected, sclera clear OROPHARYNX:no exudate, no erythema and lips, buccal mucosa, and tongue normal  NECK: supple, thyroid normal size, non-tender, without nodularity LYMPH:  no palpable lymphadenopathy in the cervical, axillary or inguinal LUNGS: clear to auscultation and percussion with normal breathing effort HEART: regular rate & rhythm and no murmurs and no lower extremity edema ABDOMEN:abdomen soft, non-tender and normal bowel sounds MUSCULOSKELETAL:no cyanosis of digits and no clubbing  NEURO: alert & oriented x 3 with fluent speech, no focal motor/sensory deficits EXTREMITIES: No lower extremity edema BREAST: No palpable masses or nodules in either right or left breasts. No palpable axillary supraclavicular or infraclavicular adenopathy no breast tenderness or nipple discharge. (exam performed in the presence of a chaperone)  LABORATORY DATA:  I have reviewed the data as listed CMP Latest Ref Rng & Units 07/04/2015 06/13/2015  Glucose 65 - 99 mg/dL 101(H) 93  BUN 6 - 20 mg/dL 12 13.7  Creatinine 0.44 - 1.00 mg/dL 0.95 1.0  Sodium 135 - 145 mmol/L 142 141  Potassium 3.5 - 5.1 mmol/L 4.5 4.0  Chloride 101 - 111 mmol/L 106 -  CO2 22 - 32 mmol/L 28 28  Calcium 8.9 - 10.3 mg/dL 9.3 9.7  Total Protein 6.4 - 8.3 g/dL - 7.6  Total Bilirubin 0.20 - 1.20 mg/dL - 0.47  Alkaline Phos 40 - 150 U/L - 63  AST 5 - 34 U/L - 19  ALT 0 - 55 U/L - 12    Lab Results  Component Value Date   WBC 4.9 07/04/2015   HGB 12.1 07/04/2015   HCT 37.7 07/04/2015   MCV 91.7 07/04/2015   PLT 217 07/04/2015   NEUTROABS 2.5 07/04/2015    ASSESSMENT & PLAN:  Breast cancer of upper-outer quadrant of right female breast ([place]) Rt Lumpectomy 07/10/15: DCIS 2.6 cm, ER 100%, PR 100%, margins Negative TisN0 Stage 0 Status post radiation therapy in [facility]  completed 09/28/2015  Current treatment: Tamoxifen 20 mg daily started 10/28/2015  Tamoxifen toxicities:  Occasional hot flashes  Tinnitus:Unrelated to tamoxifen therapy. She continues to have this symptom.  Breast Cancer Surveillance: 1. Breast exam 01/06/2018: Normal 2. Mammogram 11/13/2017No abnormalities. Postsurgical changes. Breast Density Category C. I recommended that she get 3-D mammograms for surveillance. Discussed the differences between different breast density categories.  Return to clinic in 1 year for follow-up      No orders of the defined types were placed in this encounter.  The patient has a good understanding of the overall plan. she agrees with it. she will call with any problems that may develop before the next visit here.   Harriette Ohara, MD 01/06/18

## 2018-01-06 NOTE — Telephone Encounter (Signed)
Gave patient avs and calendar of upcoming appts.  °

## 2018-01-06 NOTE — Assessment & Plan Note (Signed)
Rt Lumpectomy 07/10/15: DCIS 2.6 cm, ER 100%, PR 100%, margins Negative TisN0 Stage 0 Status post radiation therapy in [facility] completed 09/28/2015  Current treatment: Tamoxifen 20 mg daily started 10/28/2015  Tamoxifen toxicities:  Occasional hot flashes  Tinnitus:Unrelated to tamoxifen therapy. She continues to have this symptom.  Breast Cancer Surveillance: 1. Breast exam 01/06/2018: Normal 2. Mammogram 11/13/2017No abnormalities. Postsurgical changes. Breast Density Category C. I recommended that she get 3-D mammograms for surveillance. Discussed the differences between different breast density categories.  Return to clinic in 1 year for follow-up

## 2018-01-20 ENCOUNTER — Other Ambulatory Visit: Payer: Self-pay | Admitting: Hematology and Oncology

## 2018-01-20 DIAGNOSIS — Z853 Personal history of malignant neoplasm of breast: Secondary | ICD-10-CM

## 2018-05-19 ENCOUNTER — Ambulatory Visit
Admission: RE | Admit: 2018-05-19 | Discharge: 2018-05-19 | Disposition: A | Discharge status: Home / Self Care | Payer: Medicare Other | Source: Ambulatory Visit | Attending: Hematology and Oncology | Admitting: Hematology and Oncology

## 2018-05-19 DIAGNOSIS — Z853 Personal history of malignant neoplasm of breast: Secondary | ICD-10-CM

## 2018-10-06 ENCOUNTER — Other Ambulatory Visit: Payer: Self-pay | Admitting: Hematology and Oncology

## 2018-10-06 DIAGNOSIS — C50411 Malignant neoplasm of upper-outer quadrant of right female breast: Secondary | ICD-10-CM

## 2018-10-06 DIAGNOSIS — Z170 Estrogen receptor positive status [ER+]: Principal | ICD-10-CM

## 2019-01-06 NOTE — Assessment & Plan Note (Signed)
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 She continues to have this symptom.  Breast Cancer Surveillance: 1. Breast exam07/03/2018: Normal 2. Mammogram 11/20/2019no abnormalities. Postsurgical changes. Breast Density Category C.  Return to clinic in1 year for follow-up

## 2019-01-07 ENCOUNTER — Telehealth: Payer: Self-pay | Admitting: Hematology and Oncology

## 2019-01-07 NOTE — Telephone Encounter (Signed)
I left a message regarding video visit  °

## 2019-01-10 ENCOUNTER — Other Ambulatory Visit: Payer: Self-pay | Admitting: Hematology and Oncology

## 2019-01-10 DIAGNOSIS — Z9889 Other specified postprocedural states: Secondary | ICD-10-CM

## 2019-01-11 ENCOUNTER — Telehealth: Payer: Self-pay | Admitting: Hematology and Oncology

## 2019-01-11 NOTE — Telephone Encounter (Signed)
Spoke with patient to confirm telephone visit, patient advised to call her on the home number

## 2019-01-12 NOTE — Progress Notes (Signed)
  HEMATOLOGY-ONCOLOGY TELEPHONE VISIT PROGRESS NOTE  I connected with Evelyn Scott on 01/13/2019 at  1:45 PM EDT by telephone and verified that I am speaking with the correct person using two identifiers.  I discussed the limitations, risks, security and privacy concerns of performing an evaluation and management service by telephone and the availability of in person appointments.  I also discussed with the patient that there may be a patient responsible charge related to this service. The patient expressed understanding and agreed to proceed.   History of Present Illness: Evelyn Scott is a 77 y.o. female with above-mentioned history of right breast cancer treated with lumpectomy, radiation, and who is currently on tamoxifen. I last saw her a year ago. Mammogram on 05/19/2018 showed no evidence of malignancy bilaterally. She presents over the phone today for annual follow-up.   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 Dava Najjar. Right breast: 50 Gy in 25 fractions. Right breast boost: 10 Gy in 5 fractions.    10/26/2015 -  Anti-estrogen oral therapy   Tamoxifen 20 mg daily. Planned duration of treament: 5 years (Kieli Golladay)      Observations/Objective:  No evidence of disease recurrence.  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 She continues to have this symptom. She walks every day for 50 mins  Breast Cancer Surveillance: 1. Breast exam07/03/2018: Normal 2. Mammogram 11/20/2019no abnormalities. Postsurgical changes. Breast Density Category C.  Return to clinic in1 year for follow-up  I discussed the assessment and treatment plan with the patient. The patient was provided an opportunity to ask questions and all were answered. The patient agreed with the plan and demonstrated an understanding of the instructions. The patient was advised to call back or seek an in-person evaluation if the symptoms worsen or if the condition fails to improve as anticipated.   I provided 15 minutes of non-face-to-face time during this encounter.   Rulon Eisenmenger, MD 01/13/2019    I, Molly Dorshimer, am acting as scribe for Nicholas Lose, MD.  I have reviewed the above documentation for accuracy and completeness, and I agree with the above.

## 2019-01-13 ENCOUNTER — Inpatient Hospital Stay: Payer: Medicare Other | Attending: Hematology and Oncology | Admitting: Hematology and Oncology

## 2019-01-13 DIAGNOSIS — Z170 Estrogen receptor positive status [ER+]: Secondary | ICD-10-CM

## 2019-01-13 DIAGNOSIS — Z923 Personal history of irradiation: Secondary | ICD-10-CM

## 2019-01-13 DIAGNOSIS — Z79810 Long term (current) use of selective estrogen receptor modulators (SERMs): Secondary | ICD-10-CM

## 2019-01-13 DIAGNOSIS — C50411 Malignant neoplasm of upper-outer quadrant of right female breast: Secondary | ICD-10-CM | POA: Diagnosis not present

## 2019-01-14 ENCOUNTER — Telehealth: Payer: Self-pay | Admitting: Hematology and Oncology

## 2019-01-14 NOTE — Telephone Encounter (Signed)
I left a message regarding the schedule

## 2019-05-24 ENCOUNTER — Other Ambulatory Visit: Payer: Self-pay

## 2019-05-24 ENCOUNTER — Ambulatory Visit
Admission: RE | Admit: 2019-05-24 | Discharge: 2019-05-24 | Disposition: A | Discharge status: Home / Self Care | Payer: Medicare Other | Source: Ambulatory Visit | Attending: Hematology and Oncology | Admitting: Hematology and Oncology

## 2019-05-24 DIAGNOSIS — Z9889 Other specified postprocedural states: Secondary | ICD-10-CM

## 2019-10-12 ENCOUNTER — Other Ambulatory Visit: Payer: Self-pay | Admitting: Hematology and Oncology

## 2019-10-12 DIAGNOSIS — C50411 Malignant neoplasm of upper-outer quadrant of right female breast: Secondary | ICD-10-CM

## 2019-12-22 ENCOUNTER — Telehealth: Payer: Self-pay | Admitting: Hematology and Oncology

## 2019-12-22 ENCOUNTER — Other Ambulatory Visit: Payer: Self-pay | Admitting: Hematology and Oncology

## 2019-12-22 DIAGNOSIS — Z9889 Other specified postprocedural states: Secondary | ICD-10-CM

## 2019-12-22 NOTE — Telephone Encounter (Signed)
Called pt per 6/24 sch message - unable to reach pt - left message for patient to call back to reschedule.

## 2020-01-09 ENCOUNTER — Other Ambulatory Visit: Payer: Self-pay | Admitting: *Deleted

## 2020-01-09 DIAGNOSIS — Z170 Estrogen receptor positive status [ER+]: Secondary | ICD-10-CM

## 2020-01-09 MED ORDER — TAMOXIFEN CITRATE 20 MG PO TABS: 20.0000 mg | ORAL_TABLET | Freq: Every day | ORAL | 0 refills | Status: DC

## 2020-01-13 ENCOUNTER — Ambulatory Visit: Payer: Medicare Other | Admitting: Hematology and Oncology

## 2020-01-18 ENCOUNTER — Telehealth: Payer: Self-pay | Admitting: Hematology and Oncology

## 2020-01-18 ENCOUNTER — Other Ambulatory Visit: Payer: Self-pay

## 2020-01-18 ENCOUNTER — Inpatient Hospital Stay: Payer: Medicare Other | Attending: Hematology and Oncology | Admitting: Hematology and Oncology

## 2020-01-18 DIAGNOSIS — Z170 Estrogen receptor positive status [ER+]: Secondary | ICD-10-CM | POA: Diagnosis not present

## 2020-01-18 DIAGNOSIS — Z923 Personal history of irradiation: Secondary | ICD-10-CM | POA: Insufficient documentation

## 2020-01-18 DIAGNOSIS — C50411 Malignant neoplasm of upper-outer quadrant of right female breast: Secondary | ICD-10-CM | POA: Diagnosis present

## 2020-01-18 DIAGNOSIS — Z7982 Long term (current) use of aspirin: Secondary | ICD-10-CM | POA: Diagnosis not present

## 2020-01-18 DIAGNOSIS — Z79899 Other long term (current) drug therapy: Secondary | ICD-10-CM | POA: Insufficient documentation

## 2020-01-18 DIAGNOSIS — Z79810 Long term (current) use of selective estrogen receptor modulators (SERMs): Secondary | ICD-10-CM | POA: Insufficient documentation

## 2020-01-18 MED ORDER — TAMOXIFEN CITRATE 20 MG PO TABS: 20.0000 mg | ORAL_TABLET | Freq: Every day | ORAL | 2 refills | Status: DC

## 2020-01-18 NOTE — Telephone Encounter (Signed)
Scheduled appts per 7/21 los. Gave pt a print out of AVS.

## 2020-01-18 NOTE — Progress Notes (Signed)
Patient Care Team: Evelyn Scott., MD as PCP - General (Family Medicine) Evelyn Bookbinder, MD as Consulting Physician (General Surgery) Evelyn Lose, MD as Consulting Physician (Hematology and Oncology) Evelyn Gibson, MD as Attending Physician (Radiation Oncology)  DIAGNOSIS:    ICD-10-CM   1. 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 Dava Scott. Right breast: 50 Gy in 25 fractions. Right breast boost: 10 Gy in 5 fractions.    10/26/2015 -  Anti-estrogen oral therapy   Tamoxifen 20 mg daily. Planned duration of treament: 5 years Evelyn Scott)      CHIEF COMPLIANT: Follow-up of right breast cancer on tamoxifen  INTERVAL HISTORY: Evelyn Scott is a 79 y.o. with above-mentioned history of right breast cancer treated with lumpectomy, radiation, and who is currently on tamoxifen. Mammogram on 05/24/19 showed no evidence of malignancy bilaterally. She presents to the clinic today for annual follow-up.    ALLERGIES:  has No Known Allergies.  MEDICATIONS:  Current Outpatient Medications  Medication Sig Dispense Refill  . aspirin EC 81 MG tablet Take 81 mg by mouth.    Marland Kitchen atorvastatin (LIPITOR) 40 MG tablet Take 40 mg by mouth.    . Calcium Carb-Cholecalciferol (CALCIUM 600/VITAMIN D3) 600-800 MG-UNIT  TABS Take 1 tablet by mouth 2 (two) times daily.     . Cholecalciferol 2000 units CAPS Take 1 capsule (2,000 Units total) by mouth daily. 30 each   . flecainide (TAMBOCOR) 50 MG tablet Take 1 tablet (50 mg total) by mouth 2 (two) times daily.    . metoprolol tartrate (LOPRESSOR) 25 MG tablet Take 12.5 mg by mouth 2 (two) times daily.     . Multiple Vitamin (MULTIVITAMIN) capsule Take 1 capsule by mouth.    . Multiple Vitamins-Minerals (MULTIVITAMIN) tablet Take 1 tablet by mouth daily.    . Rivaroxaban (XARELTO) 15 MG TABS tablet Take 15 mg by mouth.    . tamoxifen (NOLVADEX) 20 MG tablet Take 1 tablet (20 mg total) by mouth daily. 90 tablet 0   No current facility-administered medications for this visit.    PHYSICAL EXAMINATION: ECOG PERFORMANCE STATUS: 1 - Symptomatic but completely ambulatory  Vitals:   01/18/20 1332  BP: (!) 145/62  Pulse: 69  Resp: 17  Temp: 98.2 F (36.8 C)  SpO2: 98%   Filed Weights   01/18/20 1332  Weight: 178 lb 14.4 oz (81.1 kg)    BREAST: No palpable masses or nodules in either right or left breasts. No palpable axillary supraclavicular or infraclavicular adenopathy no breast tenderness or nipple discharge. (exam performed in the presence of a chaperone)  LABORATORY DATA:  I have reviewed the data as listed CMP Latest Ref Rng & Units 07/04/2015 06/13/2015  Glucose 65 - 99 mg/dL 101(H) 93  BUN 6 - 20 mg/dL 12 13.7  Creatinine 0.44 -  1.00 mg/dL 0.95 1.0  Sodium 135 - 145 mmol/L 142 141  Potassium 3.5 - 5.1 mmol/L 4.5 4.0  Chloride 101 - 111 mmol/L 106 -  CO2 22 - 32 mmol/L 28 28  Calcium 8.9 - 10.3 mg/dL 9.3 9.7  Total Protein 6.4 - 8.3 g/dL - 7.6  Total Bilirubin 0.20 - 1.20 mg/dL - 0.47  Alkaline Phos 40 - 150 U/L - 63  AST 5 - 34 U/L - 19  ALT 0 - 55 U/L - 12    Lab Results  Component Value Date   WBC 4.9 07/04/2015   HGB 12.1 07/04/2015   HCT 37.7 07/04/2015   MCV 91.7 07/04/2015   PLT 217 07/04/2015   NEUTROABS 2.5 07/04/2015     ASSESSMENT & PLAN:  Breast cancer of upper-outer quadrant of right female breast ([place]) 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  She walks every day for 50 mins  Breast Cancer Surveillance: 1. Breast exam7/21/2021: Normal 2. Mammogram  05/24/2019:No abnormalities. Postsurgical changes. Breast Density Category C.  She will finish 5 years of tamoxifen therapy by end of March 2022.  After that she will discontinue. Return to clinic in1 year for follow-up.  After that she could be followed by her primary care physician.    No orders of the defined types were placed in this encounter.  The patient has a good understanding of the overall plan. she agrees with it. she will call with any problems that may develop before the next visit here.  Total time spent: 20 mins including face to face time and time spent for planning, charting and coordination of care  Evelyn Lose, MD 01/18/2020  I, Evelyn Scott, am acting as scribe for Dr. Nicholas Scott.  I have reviewed the above documentation for accuracy and completeness, and I agree with the above.

## 2020-01-18 NOTE — Assessment & Plan Note (Signed)
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 She continues to have this symptom. She walks every day for 50 mins  Breast Cancer Surveillance: 1. Breast exam7/21/2021: Normal 2. Mammogram  05/24/2019:No abnormalities. Postsurgical changes. Breast Density Category C.  Return to clinic in1 year for follow-up

## 2020-02-07 ENCOUNTER — Telehealth: Payer: Self-pay | Admitting: Hematology and Oncology

## 2020-02-07 NOTE — Telephone Encounter (Signed)
Rescheduled appointment per 8/5 provider message. Patient is aware of updated appointment date and time.

## 2020-05-28 ENCOUNTER — Ambulatory Visit
Admission: RE | Admit: 2020-05-28 | Discharge: 2020-05-28 | Disposition: A | Discharge status: Home / Self Care | Payer: Medicare Other | Source: Ambulatory Visit | Attending: Hematology and Oncology | Admitting: Hematology and Oncology

## 2020-05-28 ENCOUNTER — Other Ambulatory Visit: Payer: Self-pay

## 2020-05-28 DIAGNOSIS — Z9889 Other specified postprocedural states: Secondary | ICD-10-CM

## 2020-11-28 ENCOUNTER — Telehealth: Payer: Self-pay | Admitting: Hematology and Oncology

## 2020-11-28 NOTE — Telephone Encounter (Signed)
R/s per prov pal, per 8/5 sch msg, p left message

## 2021-01-01 ENCOUNTER — Other Ambulatory Visit: Payer: Self-pay | Admitting: Hematology and Oncology

## 2021-01-01 DIAGNOSIS — Z853 Personal history of malignant neoplasm of breast: Secondary | ICD-10-CM

## 2021-01-16 NOTE — Progress Notes (Signed)
Patient Care Team: Lemar Livings., MD as PCP - General (Family Medicine) Rolm Bookbinder, MD as Consulting Physician (General Surgery) Nicholas Lose, MD as Consulting Physician (Hematology and Oncology) Eppie Gibson, MD as Attending Physician (Radiation Oncology)  DIAGNOSIS:    ICD-10-CM   1. Malignant neoplasm of upper-outer quadrant of right breast in female, estrogen receptor positive ([place])  C50.411    Z17.0       SUMMARY OF ONCOLOGIC HISTORY: Oncology History  Breast cancer of upper-outer quadrant of right female breast ([place])  05/21/2015 Mammogram   Right breast suspicious calcifications spanning approximately 2.3 cm mid to posterior depth, upper outer quadrant    06/04/2015 Initial Diagnosis   Right breast biopsy: DCIS with calcifications, ALH, fibrocystic changes, UV edge, ER 100%, PR 90%, low-grade, Tis N0 stage 0    07/10/2015 Surgery   Rt Lumpectomy [name]): 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 Dava Najjar. Right breast: 50 Gy in 25 fractions. Right breast boost: 10 Gy in 5 fractions.     10/26/2015 -  Anti-estrogen oral therapy   Tamoxifen 20 mg daily. Planned duration of treament: 5 years [name])       CHIEF COMPLIANT: Follow-up of right breast cancer on tamoxifen  INTERVAL HISTORY: Evelyn Scott is a 80 y.o. with above-mentioned history of right breast cancer treated with lumpectomy, radiation, and who is currently on tamoxifen. Mammogram on 05/28/20 showed no evidence of malignancy bilaterally. She presents to the clinic today for annual follow-up.  She completed 5 years of tamoxifen therapy and stopped it at the end of April 2022.  ALLERGIES:  has No Known Allergies.  MEDICATIONS:  Current Outpatient Medications  Medication Sig Dispense Refill   aspirin EC 81 MG tablet Take 81 mg by mouth.     atorvastatin (LIPITOR) 40 MG tablet Take  40 mg by mouth.     Calcium Carb-Cholecalciferol (CALCIUM 600/VITAMIN D3) 600-800 MG-UNIT TABS Take 1 tablet by mouth 2 (two) times daily.      Cholecalciferol 2000 units CAPS Take 1 capsule (2,000 Units total) by mouth daily. 30 each    flecainide (TAMBOCOR) 50 MG tablet Take 1 tablet (50 mg total) by mouth 2 (two) times daily.     metoprolol tartrate (LOPRESSOR) 25 MG tablet Take 12.5 mg by mouth 2 (two) times daily.      Multiple Vitamin (MULTIVITAMIN) capsule Take 1 capsule by mouth.     Multiple Vitamins-Minerals (MULTIVITAMIN) tablet Take 1 tablet by mouth daily.     Rivaroxaban (XARELTO) 15 MG TABS tablet Take 15 mg by mouth.     tamoxifen (NOLVADEX) 20 MG tablet Take 1 tablet (20 mg total) by mouth daily. 90 tablet 2   No current facility-administered medications for this visit.    PHYSICAL EXAMINATION: ECOG PERFORMANCE STATUS: 1 - Symptomatic but completely ambulatory  Vitals:   01/17/21 1437  BP: (!) 156/76  Pulse: (!) 57  Resp: 20  Temp: 97.6 F (36.4 C)  SpO2: 100%   Filed Weights   01/17/21 1437  Weight: 168 lb 11.2 oz (76.5 kg)    BREAST: No palpable masses or nodules in either right or left breasts. No palpable axillary supraclavicular or infraclavicular adenopathy no breast tenderness or nipple discharge. (exam performed in the presence of a chaperone)  LABORATORY DATA:  I have reviewed the data as listed CMP Latest Ref Rng &  Units 07/04/2015 06/13/2015  Glucose 65 - 99 mg/dL 101(H) 93  BUN 6 - 20 mg/dL 12 13.7  Creatinine 0.44 - 1.00 mg/dL 0.95 1.0  Sodium 135 - 145 mmol/L 142 141  Potassium 3.5 - 5.1 mmol/L 4.5 4.0  Chloride 101 - 111 mmol/L 106 -  CO2 22 - 32 mmol/L 28 28  Calcium 8.9 - 10.3 mg/dL 9.3 9.7  Total Protein 6.4 - 8.3 g/dL - 7.6  Total Bilirubin 0.20 - 1.20 mg/dL - 0.47  Alkaline Phos 40 - 150 U/L - 63  AST 5 - 34 U/L - 19  ALT 0 - 55 U/L - 12    Lab Results  Component Value Date   WBC 4.9 07/04/2015   HGB 12.1 07/04/2015   HCT 37.7  07/04/2015   MCV 91.7 07/04/2015   PLT 217 07/04/2015   NEUTROABS 2.5 07/04/2015    ASSESSMENT & PLAN:  Breast cancer of upper-outer quadrant of right female breast ([place]) 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   She walks every day for 50 mins    Breast Cancer Surveillance: 1. Breast exam 01/17/2021: Normal 2. Mammogram 05/28/2020: No abnormalities. Postsurgical changes. Breast Density Category C. Completed 5 years of tamoxifen by April 2022.  Return to clinic on as-needed basis    No orders of the defined types were placed in this encounter.  The patient has a good understanding of the overall plan. she agrees with it. she will call with any problems that may develop before the next visit here.  Total time spent: 20 mins including face to face time and time spent for planning, charting and coordination of care  Rulon Eisenmenger, MD, MPH 01/17/2021  I, Thana Ates, am acting as scribe for Dr. Nicholas Lose.  I have reviewed the above documentation for accuracy and completeness, and I agree with the above.

## 2021-01-17 ENCOUNTER — Inpatient Hospital Stay: Payer: Medicare Other | Attending: Hematology and Oncology | Admitting: Hematology and Oncology

## 2021-01-17 ENCOUNTER — Other Ambulatory Visit: Payer: Self-pay

## 2021-01-17 DIAGNOSIS — C50411 Malignant neoplasm of upper-outer quadrant of right female breast: Secondary | ICD-10-CM | POA: Diagnosis present

## 2021-01-17 DIAGNOSIS — Z79810 Long term (current) use of selective estrogen receptor modulators (SERMs): Secondary | ICD-10-CM | POA: Insufficient documentation

## 2021-01-17 DIAGNOSIS — Z170 Estrogen receptor positive status [ER+]: Secondary | ICD-10-CM | POA: Diagnosis not present

## 2021-01-17 NOTE — Assessment & Plan Note (Signed)
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  She walks every day for 50 mins  Breast Cancer Surveillance: 1. Breast exam7/21/2022: Normal 2. Mammogram11/29/2021:No abnormalities. Postsurgical changes. Breast Density Category C.  She will finish 5 years of tamoxifen therapy by end of March 2022.   Return to clinic on as-needed basis

## 2021-01-23 ENCOUNTER — Ambulatory Visit: Payer: Medicare Other | Admitting: Hematology and Oncology

## 2021-01-24 ENCOUNTER — Ambulatory Visit: Payer: Medicare Other | Admitting: Hematology and Oncology

## 2021-05-31 ENCOUNTER — Ambulatory Visit
Admission: RE | Admit: 2021-05-31 | Discharge: 2021-05-31 | Disposition: A | Discharge status: Home / Self Care | Payer: Medicare Other | Source: Ambulatory Visit | Attending: Hematology and Oncology | Admitting: Hematology and Oncology

## 2021-05-31 ENCOUNTER — Other Ambulatory Visit: Payer: Self-pay

## 2021-05-31 DIAGNOSIS — Z853 Personal history of malignant neoplasm of breast: Secondary | ICD-10-CM

## 2021-11-13 ENCOUNTER — Other Ambulatory Visit: Payer: Self-pay | Admitting: Hematology and Oncology

## 2021-11-13 DIAGNOSIS — Z9889 Other specified postprocedural states: Secondary | ICD-10-CM

## 2021-12-09 ENCOUNTER — Telehealth: Payer: Self-pay | Admitting: Hematology and Oncology

## 2021-12-09 NOTE — Telephone Encounter (Signed)
Rescheduled appointment per provider request. Left message. Patient will be mailed an updated calendar.

## 2022-01-17 ENCOUNTER — Ambulatory Visit: Payer: Medicare Other | Admitting: Hematology and Oncology

## 2022-01-30 ENCOUNTER — Ambulatory Visit: Payer: Medicare Other | Admitting: Hematology and Oncology

## 2022-06-18 ENCOUNTER — Ambulatory Visit
Admission: RE | Admit: 2022-06-18 | Discharge: 2022-06-18 | Disposition: A | Discharge status: Home / Self Care | Payer: Medicare Other | Source: Ambulatory Visit | Attending: Hematology and Oncology | Admitting: Hematology and Oncology

## 2022-06-18 DIAGNOSIS — Z9889 Other specified postprocedural states: Secondary | ICD-10-CM

## 2022-12-06 IMAGING — MG DIGITAL DIAGNOSTIC BILAT W/ TOMO W/ CAD
6 of 9 series · 6 of 25 positions shown · non-contrast
Comparison: Previous exam(s).
COMPARISON: Previous exam(s).

Addendum:
CLINICAL DATA: History of RIGHT lumpectomy 5815.

EXAM:
DIGITAL DIAGNOSTIC BILATERAL MAMMOGRAM WITH TOMOSYNTHESIS AND CAD
TECHNIQUE: Bilateral digital diagnostic mammography and breast tomosynthesis
was performed. The images were evaluated with computer-aided
detection.

[R MLO]
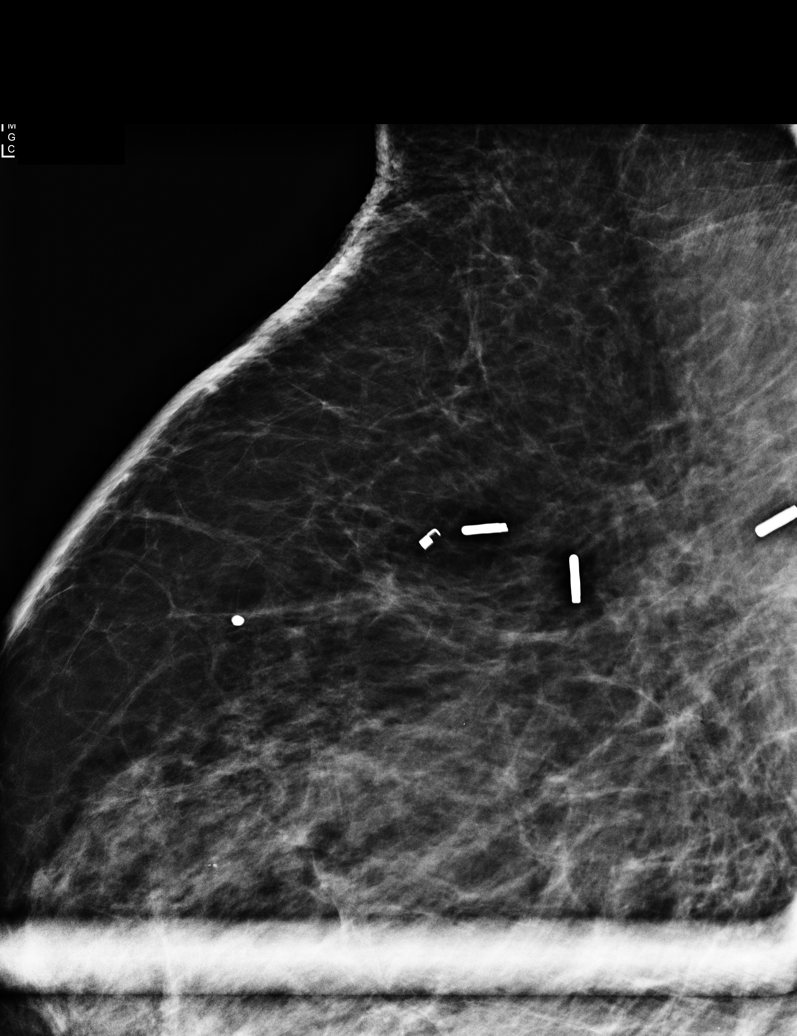

[L MLO synth-2D]
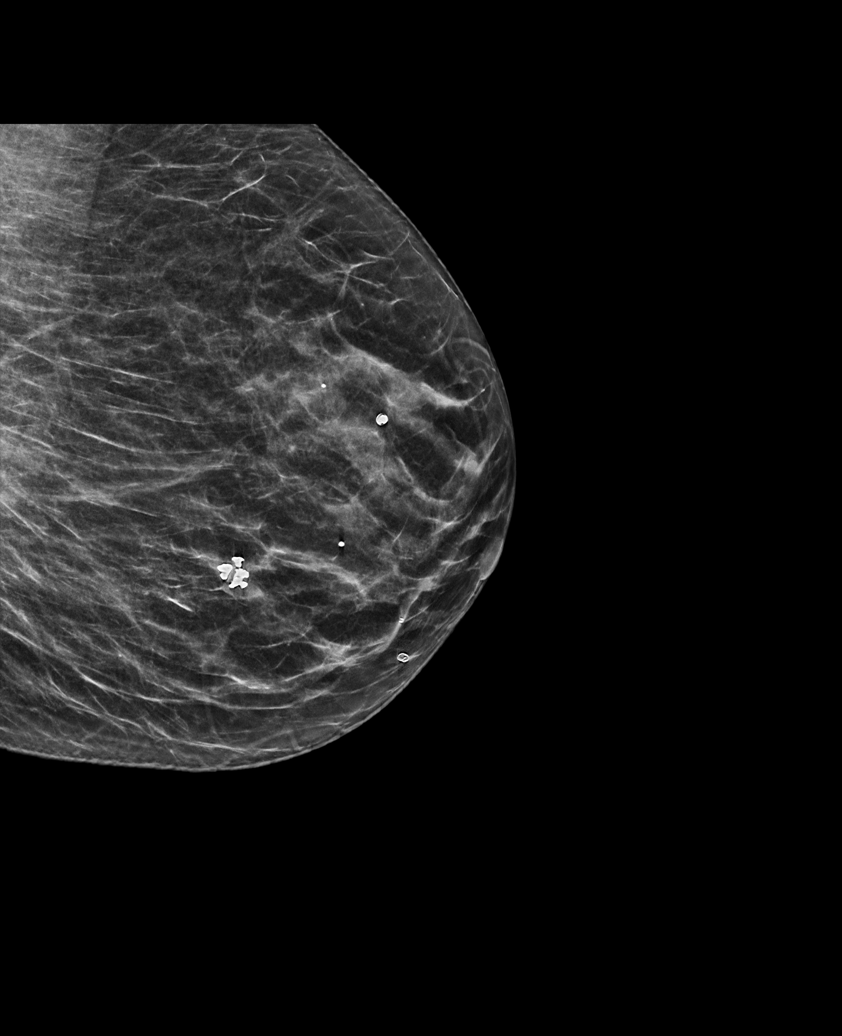

[L CC synth-2D]
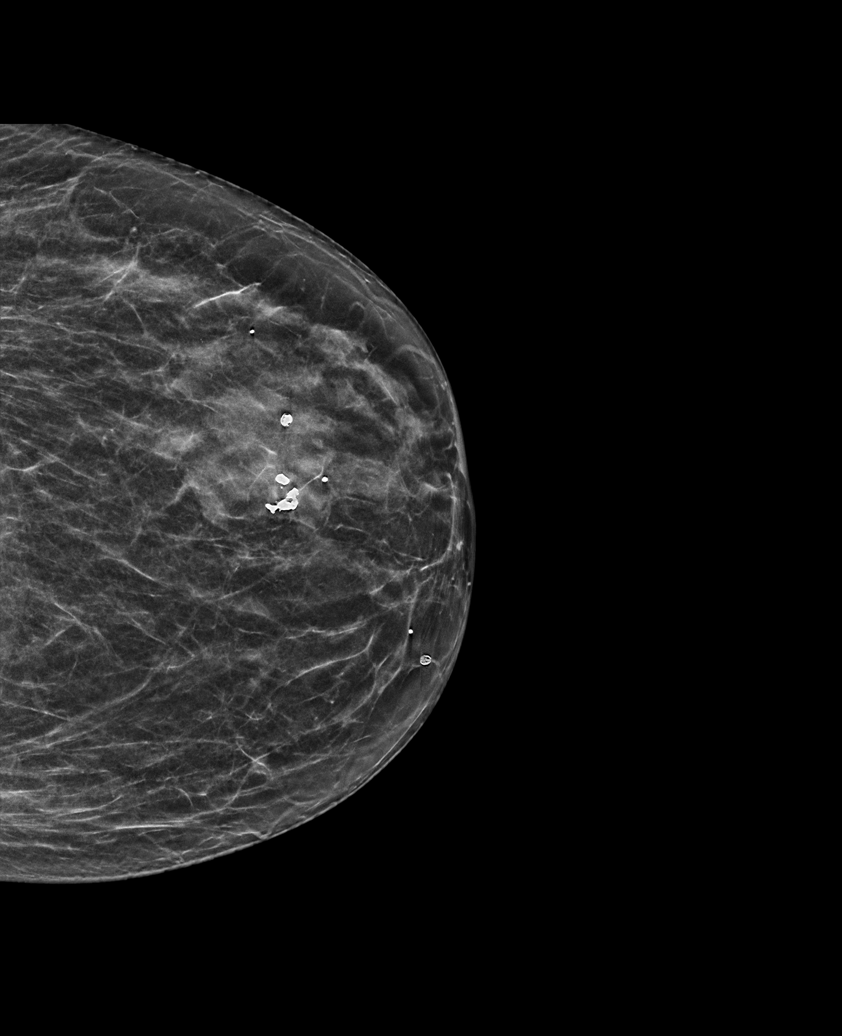

[R MLO synth-2D]
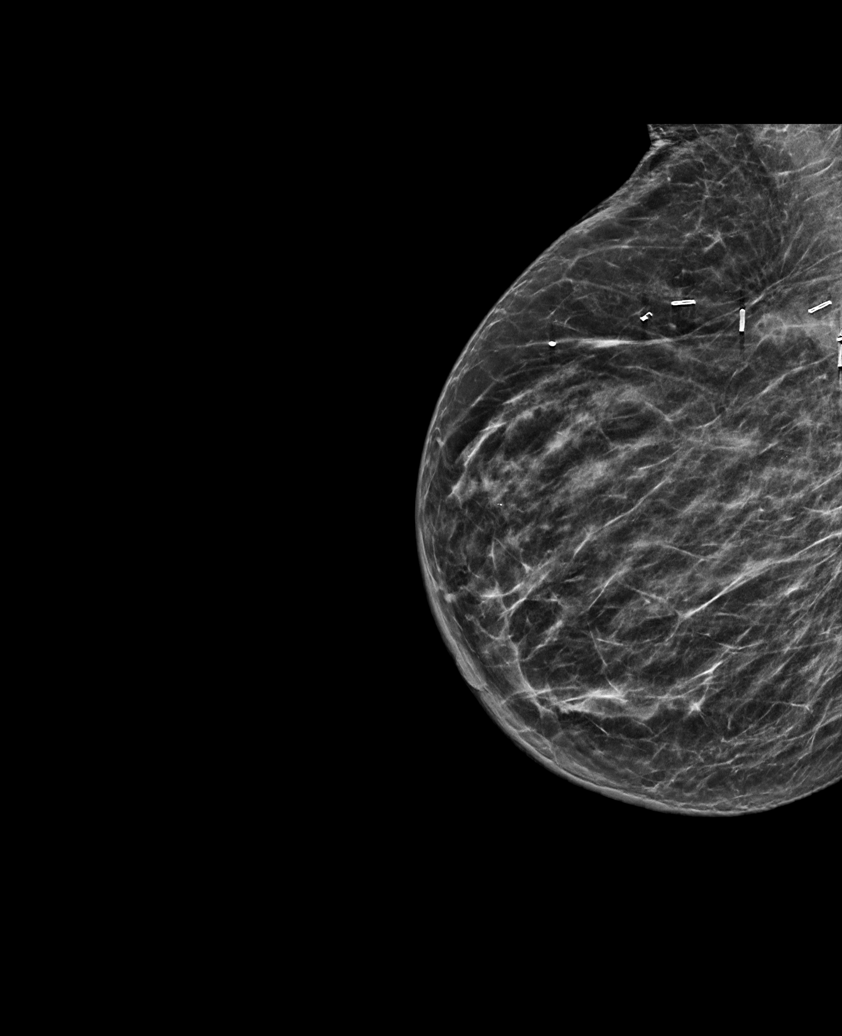

[R CC synth-2D]
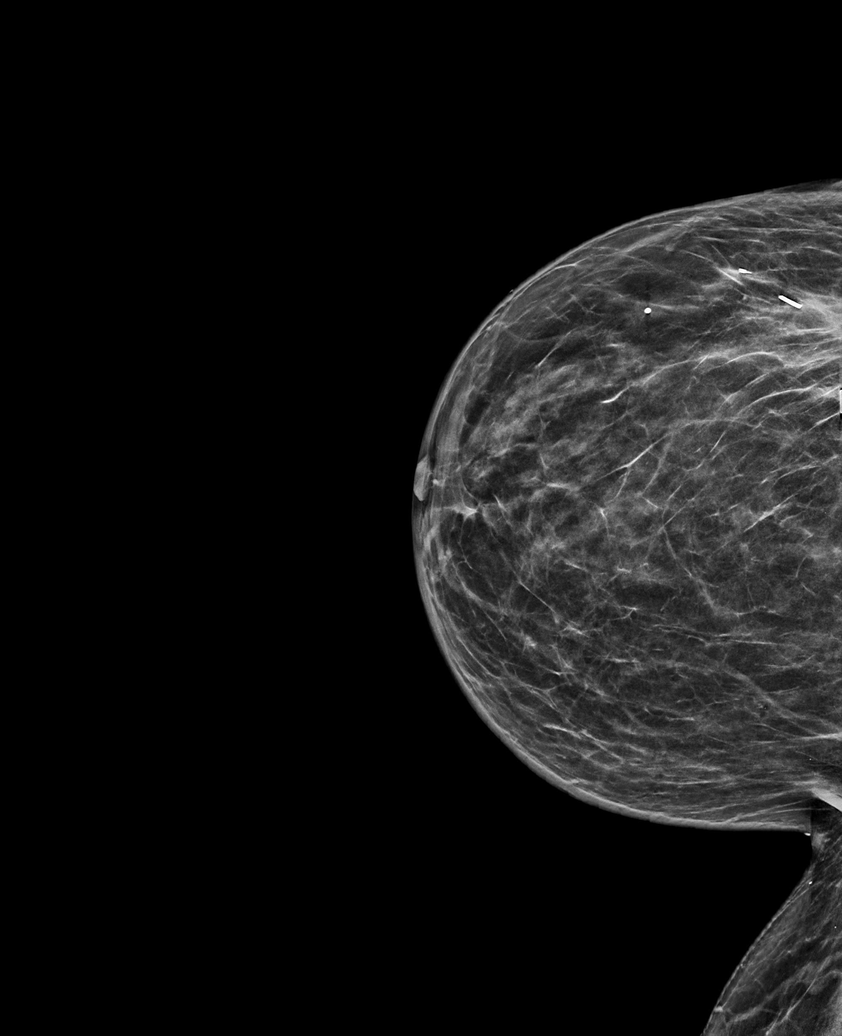

[L CC tomo · tomo slice 27/53.0]
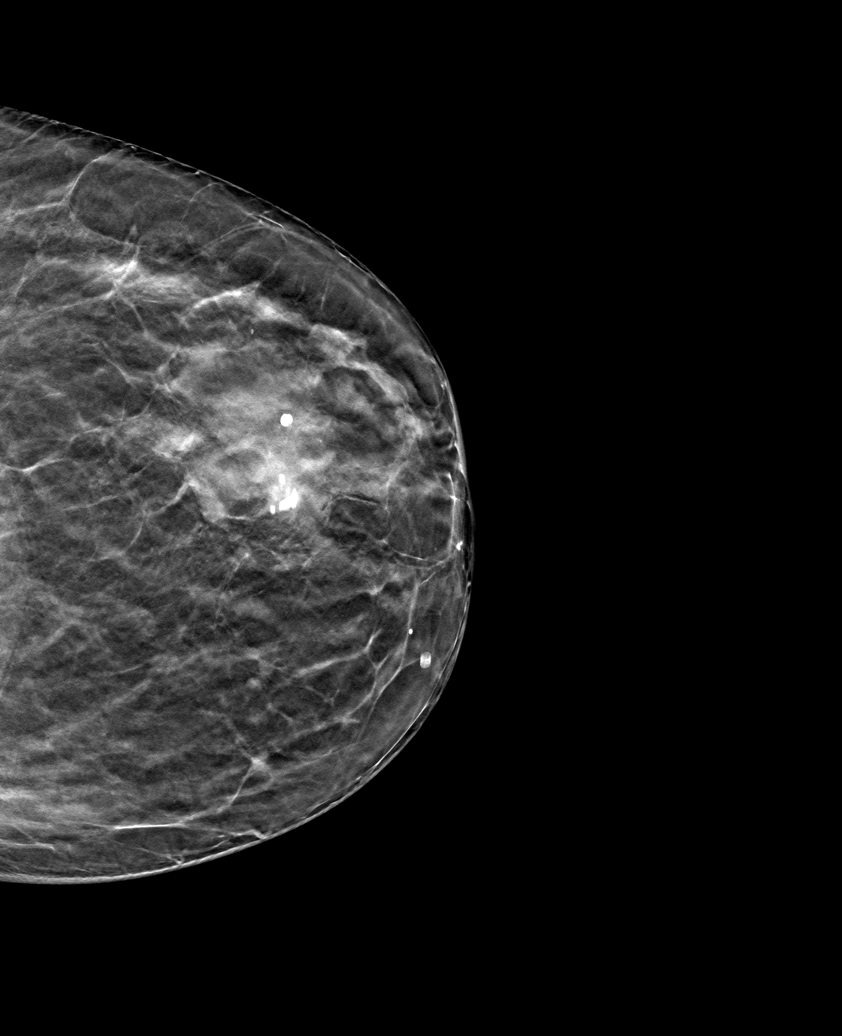

[6 of 25 positions shown; findings below may reference images not displayed]

ACR Breast Density Category c: The breast tissue is heterogeneously
dense, which may obscure small masses.
FINDINGS: Post operative changes are seen in the RIGHTbreast. No suspicious
mass, distortion, or microcalcifications are identified to suggest
presence of malignancy.
IMPRESSION: No mammographic evidence for malignancy.

RECOMMENDATION:
Per protocol, as the patient is now 2 or more years status post
lumpectomy, she may return to annual screening mammography in 1
year. However, given the history of breast cancer, the patient
remains eligible for annual diagnostic mammography if preferred.

I have discussed the findings and recommendations with the patient.
If applicable, a reminder letter will be sent to the patient
regarding the next appointment.

BI-RADS CATEGORY  2: Benign.

ADDENDUM:
History of RIGHT lumpectomy 8711.

*** End of Addendum ***
ACR Breast Density Category c: The breast tissue is heterogeneously
dense, which may obscure small masses.
FINDINGS: Post operative changes are seen in the RIGHTbreast. No suspicious
mass, distortion, or microcalcifications are identified to suggest
presence of malignancy.
IMPRESSION: No mammographic evidence for malignancy.

RECOMMENDATION:
Per protocol, as the patient is now 2 or more years status post
lumpectomy, she may return to annual screening mammography in 1
year. However, given the history of breast cancer, the patient
remains eligible for annual diagnostic mammography if preferred.

I have discussed the findings and recommendations with the patient.
If applicable, a reminder letter will be sent to the patient
regarding the next appointment.

BI-RADS CATEGORY  2: Benign.

## 2023-05-06 ENCOUNTER — Other Ambulatory Visit: Payer: Self-pay | Admitting: Hematology and Oncology

## 2023-05-06 DIAGNOSIS — Z1231 Encounter for screening mammogram for malignant neoplasm of breast: Secondary | ICD-10-CM

## 2023-06-22 ENCOUNTER — Ambulatory Visit
Admission: RE | Admit: 2023-06-22 | Discharge: 2023-06-22 | Disposition: A | Discharge status: Home / Self Care | Payer: Medicare Other | Source: Ambulatory Visit | Attending: Hematology and Oncology

## 2023-06-22 DIAGNOSIS — Z1231 Encounter for screening mammogram for malignant neoplasm of breast: Secondary | ICD-10-CM

## 2024-05-16 ENCOUNTER — Other Ambulatory Visit: Payer: Self-pay | Admitting: Hematology and Oncology

## 2024-05-16 DIAGNOSIS — Z1231 Encounter for screening mammogram for malignant neoplasm of breast: Secondary | ICD-10-CM

## 2024-06-28 ENCOUNTER — Ambulatory Visit
Admission: RE | Admit: 2024-06-28 | Discharge: 2024-06-28 | Disposition: A | Discharge status: Home / Self Care | Source: Ambulatory Visit | Attending: Hematology and Oncology | Admitting: Hematology and Oncology

## 2024-06-28 DIAGNOSIS — Z1231 Encounter for screening mammogram for malignant neoplasm of breast: Secondary | ICD-10-CM
